# Patient Record
Sex: Female | Born: 1935 | Race: White | Hispanic: No | Smoking: Never smoker
Health system: Southern US, Community
[De-identification: ages and names within clinical notes are randomized; demographics above are authoritative.]

## PROBLEM LIST (undated history)

## (undated) DIAGNOSIS — R3129 Other microscopic hematuria: Secondary | ICD-10-CM

## (undated) DIAGNOSIS — K5790 Diverticulosis of intestine, part unspecified, without perforation or abscess without bleeding: Secondary | ICD-10-CM

## (undated) DIAGNOSIS — M8580 Other specified disorders of bone density and structure, unspecified site: Secondary | ICD-10-CM

## (undated) DIAGNOSIS — I10 Essential (primary) hypertension: Secondary | ICD-10-CM

## (undated) HISTORY — PX: TOTAL ABDOMINAL HYSTERECTOMY: SHX209

## (undated) HISTORY — DX: Other specified disorders of bone density and structure, unspecified site: M85.80

## (undated) HISTORY — PX: COLONOSCOPY: SHX174

## (undated) HISTORY — DX: Other microscopic hematuria: R31.29

## (undated) HISTORY — DX: Diverticulosis of intestine, part unspecified, without perforation or abscess without bleeding: K57.90

## (undated) HISTORY — PX: APPENDECTOMY: SHX54

---

## 1997-12-12 ENCOUNTER — Other Ambulatory Visit: Admission: RE | Admit: 1997-12-12 | Discharge: 1997-12-12 | Payer: Self-pay | Admitting: *Deleted

## 1998-12-20 ENCOUNTER — Other Ambulatory Visit: Admission: RE | Admit: 1998-12-20 | Discharge: 1998-12-20 | Payer: Self-pay | Admitting: *Deleted

## 1999-12-23 ENCOUNTER — Other Ambulatory Visit: Admission: RE | Admit: 1999-12-23 | Discharge: 1999-12-23 | Payer: Self-pay | Admitting: *Deleted

## 2000-12-28 ENCOUNTER — Other Ambulatory Visit: Admission: RE | Admit: 2000-12-28 | Discharge: 2000-12-28 | Payer: Self-pay | Admitting: *Deleted

## 2006-04-15 ENCOUNTER — Encounter: Admission: RE | Admit: 2006-04-15 | Discharge: 2006-04-15 | Payer: Self-pay | Admitting: Internal Medicine

## 2006-04-19 ENCOUNTER — Encounter: Admission: RE | Admit: 2006-04-19 | Discharge: 2006-04-19 | Payer: Self-pay | Admitting: Internal Medicine

## 2006-05-13 ENCOUNTER — Ambulatory Visit (HOSPITAL_COMMUNITY): Admission: RE | Admit: 2006-05-13 | Discharge: 2006-05-13 | Payer: Self-pay | Admitting: *Deleted

## 2008-04-29 IMAGING — CT CT ABDOMEN W/ CM
2 of 5 series · 17 of 46 positions shown, 19 images · IV contrast (READICAT/WATER & [ID] OMNI 300)
Comparison: none

CLINICAL DATA: Right abdominal pain. Previous appendectomy.

[Series 3: routine abdomen · axial · 0.60mm/px · z∈[-386,-40]mm · 14 of 76 slices shown, 16 images]
[im 5/76  soft-tissue]
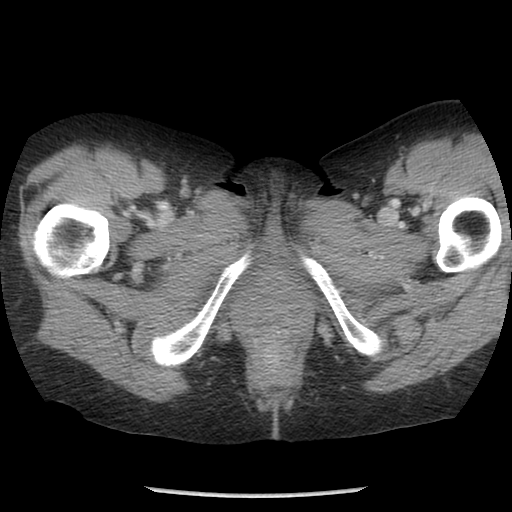
[im 5/76  bone]
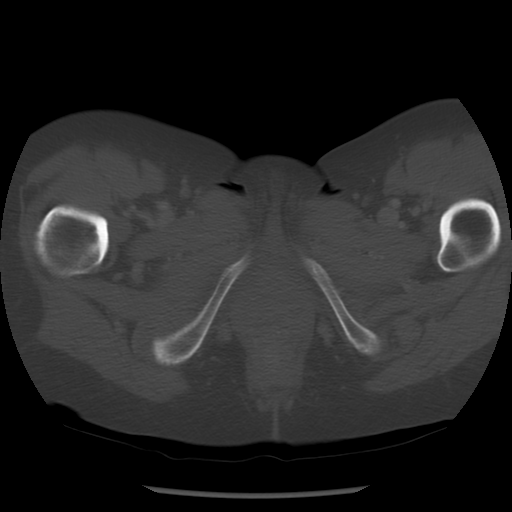
[im 9/76  soft-tissue]
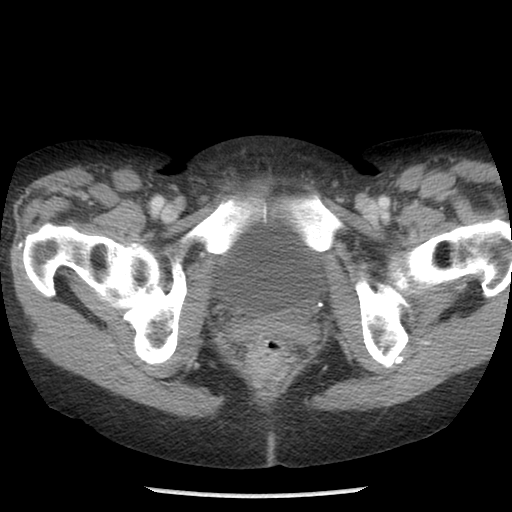
[im 14/76  soft-tissue]
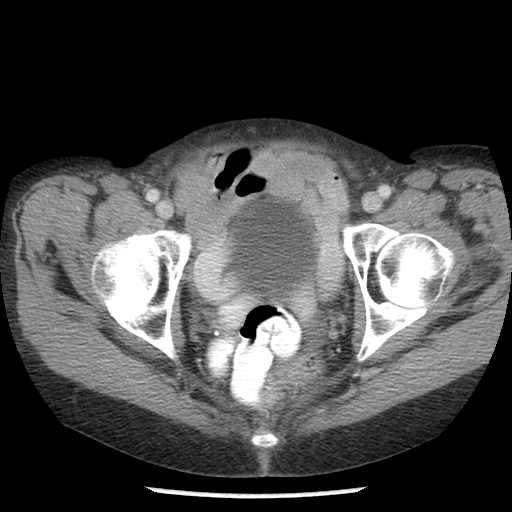
[im 23/76  soft-tissue]
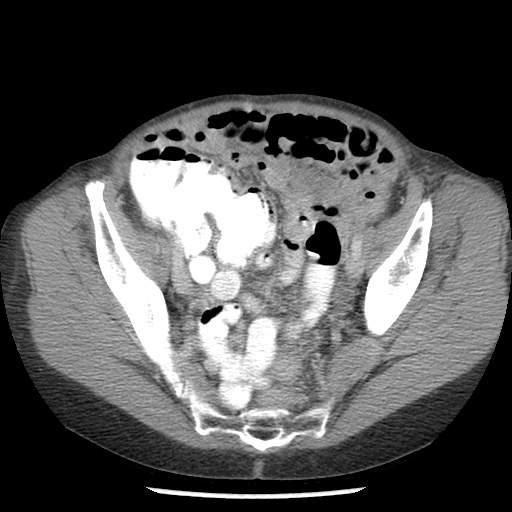
[im 27/76  soft-tissue]
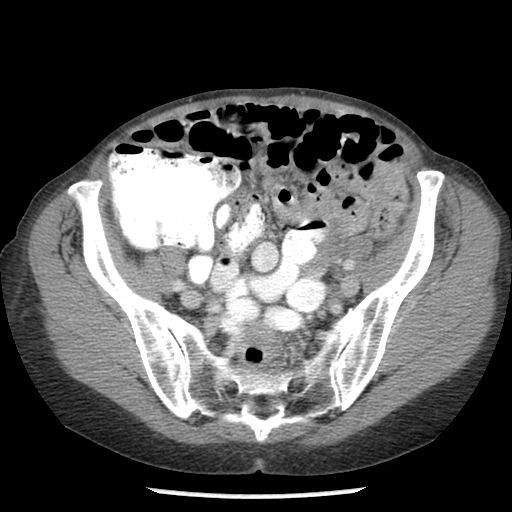
[im 31/76  soft-tissue]
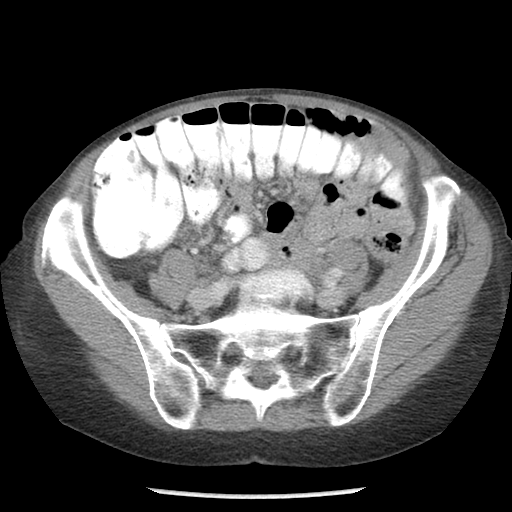
[im 36/76  soft-tissue]
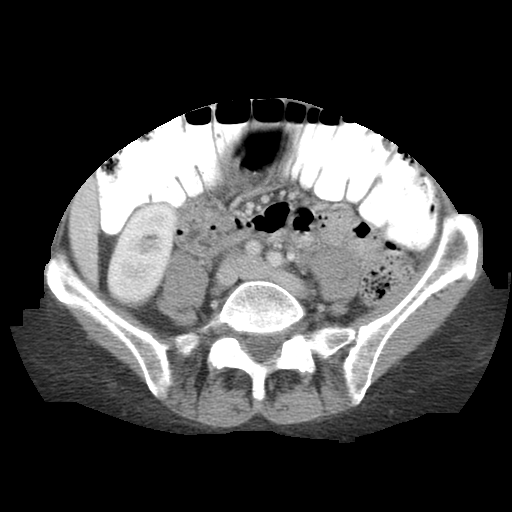
[im 40/76  soft-tissue]
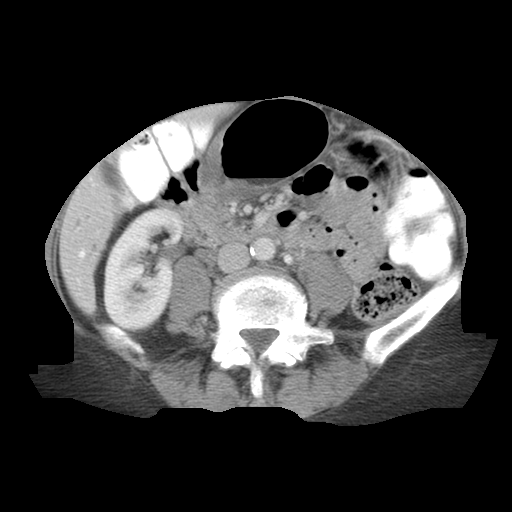
[im 45/76  soft-tissue]
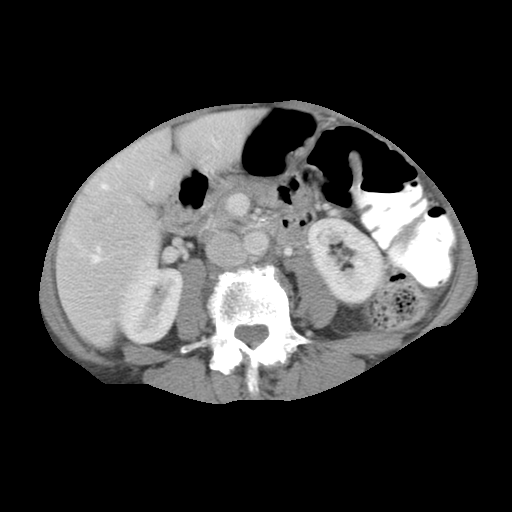
[im 45/76  bone]
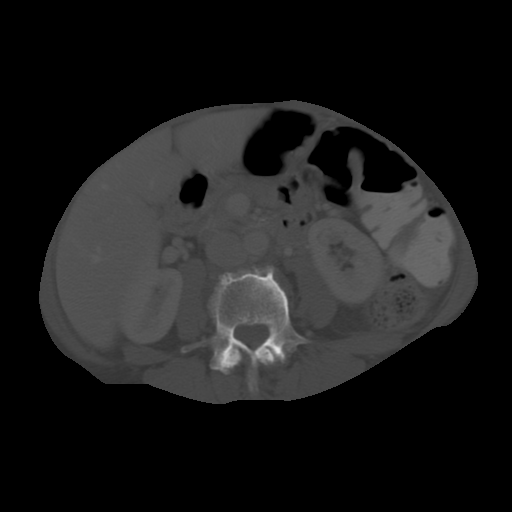
[im 49/76  soft-tissue]
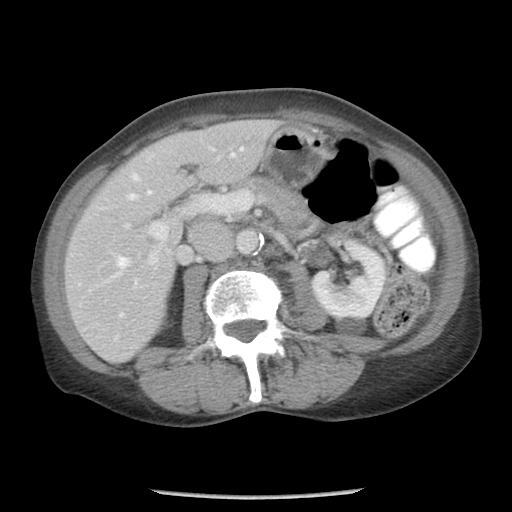
[im 58/76  soft-tissue]
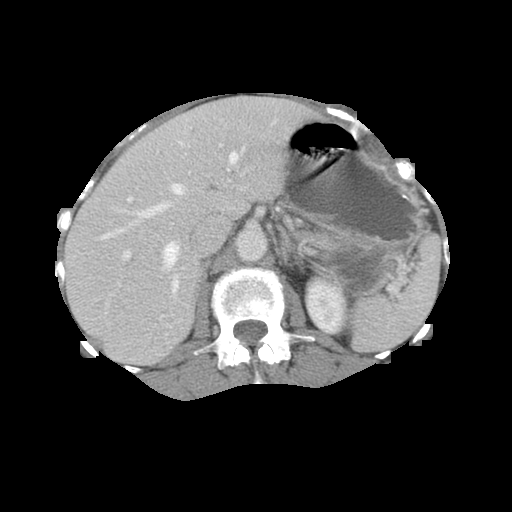
[im 62/76  soft-tissue]
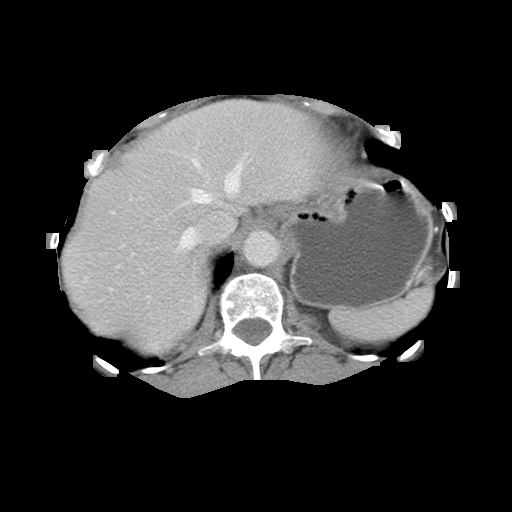
[im 67/76  soft-tissue]
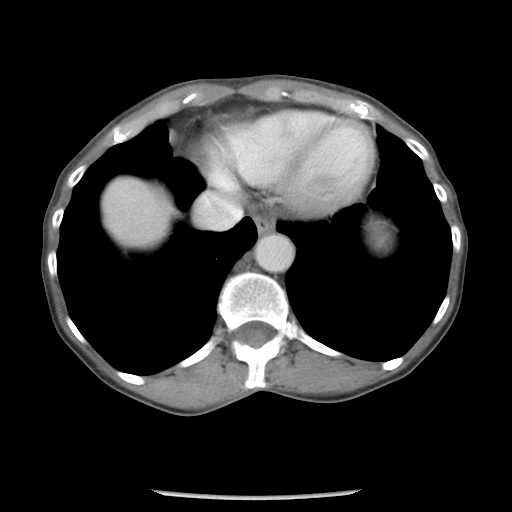
[im 71/76  soft-tissue]
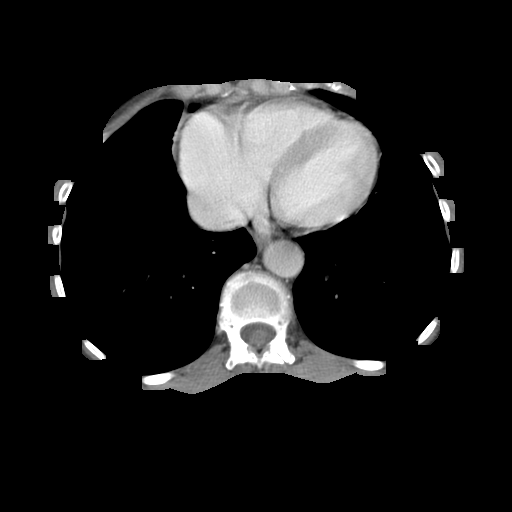

[Series 602: sagittal body · sagittal · 0.77mm/px · 3 of 123 slices shown]
[im 41/123  soft-tissue]
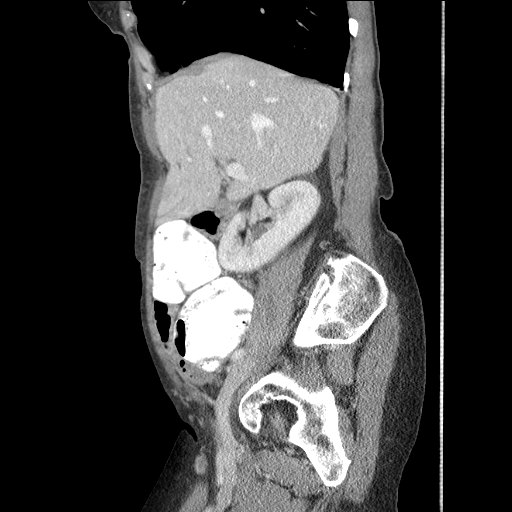
[im 55/123  soft-tissue]
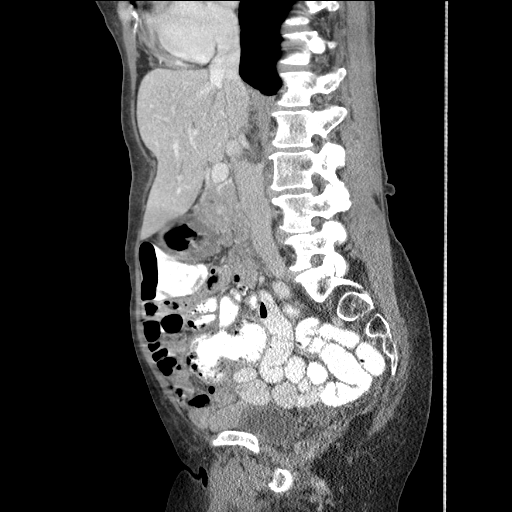
[im 68/123  soft-tissue]
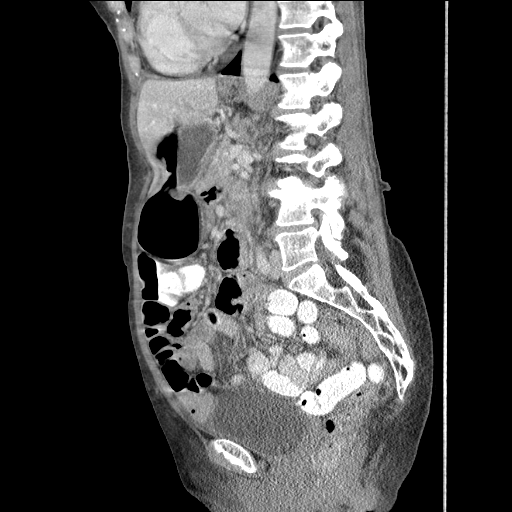

[17 of 46 positions shown; findings below may reference images not displayed]

CT abdomen with contrast:

Multidetector helical CT after 100 ml [id number]-8BB IV.
Comparison 04/15/2006. Visualized lung bases clear. Unremarkable liver,
nondistended gallbladder, spleen, adrenal glands, right kidney. Stable exophytic
cyst from the interpolar region left kidney. No hydronephrosis. There may be
some mild thickening in the region the pylorus, evaluation limited by lack of
oral contrast through this region. Minimal atheromatous change in the abdominal
aorta without aneurysm. Portal vein patent. Small bowel is nondilated. No free
air. No ascites. No adenopathy. Delayed scans showed normal excretion by both
kidneys. Coronal and sagittal reconstructions confirm the above findings.
Degenerative changes in the lumbar spine are noted.
IMPRESSION: 1. Unremarkable CT abdomen.

CT pelvis with contrast:

Colon is nondilated, unremarkable. Urinary bladder physiologically distended.
Bilateral pelvic phleboliths. No free fluid. No adenopathy.
IMPRESSION: 1. Unremarkable CT pelvis.

## 2012-11-15 ENCOUNTER — Encounter: Payer: Self-pay | Admitting: Obstetrics & Gynecology

## 2012-11-16 ENCOUNTER — Encounter: Payer: Self-pay | Admitting: Obstetrics & Gynecology

## 2012-11-16 ENCOUNTER — Ambulatory Visit: Payer: Self-pay | Admitting: Obstetrics & Gynecology

## 2012-11-16 DIAGNOSIS — Z01419 Encounter for gynecological examination (general) (routine) without abnormal findings: Secondary | ICD-10-CM

## 2012-11-18 ENCOUNTER — Ambulatory Visit: Payer: Self-pay | Admitting: Obstetrics & Gynecology

## 2014-02-19 ENCOUNTER — Encounter: Payer: Self-pay | Admitting: Obstetrics & Gynecology

## 2014-05-07 DIAGNOSIS — F411 Generalized anxiety disorder: Secondary | ICD-10-CM | POA: Diagnosis not present

## 2014-05-07 DIAGNOSIS — M810 Age-related osteoporosis without current pathological fracture: Secondary | ICD-10-CM | POA: Diagnosis not present

## 2014-05-28 DIAGNOSIS — J310 Chronic rhinitis: Secondary | ICD-10-CM | POA: Diagnosis not present

## 2014-05-28 DIAGNOSIS — L309 Dermatitis, unspecified: Secondary | ICD-10-CM | POA: Diagnosis not present

## 2014-10-23 DIAGNOSIS — F5104 Psychophysiologic insomnia: Secondary | ICD-10-CM | POA: Diagnosis not present

## 2014-10-23 DIAGNOSIS — M810 Age-related osteoporosis without current pathological fracture: Secondary | ICD-10-CM | POA: Diagnosis not present

## 2014-11-26 DIAGNOSIS — L03116 Cellulitis of left lower limb: Secondary | ICD-10-CM | POA: Diagnosis not present

## 2014-12-17 DIAGNOSIS — L03116 Cellulitis of left lower limb: Secondary | ICD-10-CM | POA: Diagnosis not present

## 2015-02-16 DIAGNOSIS — L03116 Cellulitis of left lower limb: Secondary | ICD-10-CM | POA: Diagnosis not present

## 2015-03-15 DIAGNOSIS — L03116 Cellulitis of left lower limb: Secondary | ICD-10-CM | POA: Diagnosis not present

## 2015-05-21 DIAGNOSIS — I10 Essential (primary) hypertension: Secondary | ICD-10-CM | POA: Diagnosis not present

## 2015-05-21 DIAGNOSIS — R238 Other skin changes: Secondary | ICD-10-CM | POA: Diagnosis not present

## 2015-05-21 DIAGNOSIS — Z23 Encounter for immunization: Secondary | ICD-10-CM | POA: Diagnosis not present

## 2015-05-21 DIAGNOSIS — Z0000 Encounter for general adult medical examination without abnormal findings: Secondary | ICD-10-CM | POA: Diagnosis not present

## 2015-05-21 DIAGNOSIS — Z79899 Other long term (current) drug therapy: Secondary | ICD-10-CM | POA: Diagnosis not present

## 2015-05-21 DIAGNOSIS — M8580 Other specified disorders of bone density and structure, unspecified site: Secondary | ICD-10-CM | POA: Diagnosis not present

## 2015-05-24 DIAGNOSIS — I10 Essential (primary) hypertension: Secondary | ICD-10-CM | POA: Diagnosis not present

## 2015-05-24 DIAGNOSIS — M810 Age-related osteoporosis without current pathological fracture: Secondary | ICD-10-CM | POA: Diagnosis not present

## 2015-05-24 DIAGNOSIS — Z0000 Encounter for general adult medical examination without abnormal findings: Secondary | ICD-10-CM | POA: Diagnosis not present

## 2015-08-26 DIAGNOSIS — S52531A Colles' fracture of right radius, initial encounter for closed fracture: Secondary | ICD-10-CM | POA: Diagnosis not present

## 2015-08-26 DIAGNOSIS — M25531 Pain in right wrist: Secondary | ICD-10-CM | POA: Diagnosis not present

## 2015-09-09 DIAGNOSIS — S52531D Colles' fracture of right radius, subsequent encounter for closed fracture with routine healing: Secondary | ICD-10-CM | POA: Diagnosis not present

## 2015-09-20 ENCOUNTER — Emergency Department (HOSPITAL_COMMUNITY)
Admission: EM | Admit: 2015-09-20 | Discharge: 2015-09-20 | Disposition: A | Discharge status: Home / Self Care | Payer: No Typology Code available for payment source | Attending: Emergency Medicine | Admitting: Emergency Medicine

## 2015-09-20 ENCOUNTER — Encounter (HOSPITAL_COMMUNITY): Payer: Self-pay | Admitting: Emergency Medicine

## 2015-09-20 DIAGNOSIS — S8012XA Contusion of left lower leg, initial encounter: Secondary | ICD-10-CM | POA: Diagnosis not present

## 2015-09-20 DIAGNOSIS — Y92410 Unspecified street and highway as the place of occurrence of the external cause: Secondary | ICD-10-CM | POA: Diagnosis not present

## 2015-09-20 DIAGNOSIS — R0789 Other chest pain: Secondary | ICD-10-CM | POA: Diagnosis not present

## 2015-09-20 DIAGNOSIS — S8011XA Contusion of right lower leg, initial encounter: Secondary | ICD-10-CM | POA: Insufficient documentation

## 2015-09-20 DIAGNOSIS — R071 Chest pain on breathing: Secondary | ICD-10-CM | POA: Diagnosis not present

## 2015-09-20 DIAGNOSIS — Y999 Unspecified external cause status: Secondary | ICD-10-CM | POA: Diagnosis not present

## 2015-09-20 DIAGNOSIS — Y939 Activity, unspecified: Secondary | ICD-10-CM | POA: Insufficient documentation

## 2015-09-20 DIAGNOSIS — S8991XA Unspecified injury of right lower leg, initial encounter: Secondary | ICD-10-CM | POA: Diagnosis present

## 2015-09-20 DIAGNOSIS — S2220XA Unspecified fracture of sternum, initial encounter for closed fracture: Secondary | ICD-10-CM | POA: Diagnosis not present

## 2015-09-20 DIAGNOSIS — Z041 Encounter for examination and observation following transport accident: Secondary | ICD-10-CM | POA: Diagnosis not present

## 2015-09-20 HISTORY — DX: Essential (primary) hypertension: I10
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
## 2015-09-20 NOTE — Discharge Instructions (Signed)

## 2015-10-21 DIAGNOSIS — S52571A Other intraarticular fracture of lower end of right radius, initial encounter for closed fracture: Secondary | ICD-10-CM | POA: Diagnosis not present

## 2015-10-21 DIAGNOSIS — M720 Palmar fascial fibromatosis [Dupuytren]: Secondary | ICD-10-CM | POA: Diagnosis not present

## 2016-01-13 DIAGNOSIS — M720 Palmar fascial fibromatosis [Dupuytren]: Secondary | ICD-10-CM | POA: Diagnosis not present

## 2016-01-15 DIAGNOSIS — M720 Palmar fascial fibromatosis [Dupuytren]: Secondary | ICD-10-CM | POA: Diagnosis not present

## 2016-05-28 DIAGNOSIS — I10 Essential (primary) hypertension: Secondary | ICD-10-CM | POA: Diagnosis not present

## 2016-05-28 DIAGNOSIS — M8580 Other specified disorders of bone density and structure, unspecified site: Secondary | ICD-10-CM | POA: Diagnosis not present

## 2016-05-28 DIAGNOSIS — Z0000 Encounter for general adult medical examination without abnormal findings: Secondary | ICD-10-CM | POA: Diagnosis not present

## 2016-05-28 DIAGNOSIS — E559 Vitamin D deficiency, unspecified: Secondary | ICD-10-CM | POA: Diagnosis not present

## 2016-05-28 DIAGNOSIS — M810 Age-related osteoporosis without current pathological fracture: Secondary | ICD-10-CM | POA: Diagnosis not present

## 2016-05-29 DIAGNOSIS — M720 Palmar fascial fibromatosis [Dupuytren]: Secondary | ICD-10-CM | POA: Diagnosis not present

## 2016-05-29 DIAGNOSIS — K649 Unspecified hemorrhoids: Secondary | ICD-10-CM | POA: Diagnosis not present

## 2016-05-29 DIAGNOSIS — E875 Hyperkalemia: Secondary | ICD-10-CM | POA: Diagnosis not present

## 2016-05-29 DIAGNOSIS — I10 Essential (primary) hypertension: Secondary | ICD-10-CM | POA: Diagnosis not present

## 2016-05-29 DIAGNOSIS — Z0001 Encounter for general adult medical examination with abnormal findings: Secondary | ICD-10-CM | POA: Diagnosis not present

## 2016-06-12 DIAGNOSIS — H524 Presbyopia: Secondary | ICD-10-CM | POA: Diagnosis not present

## 2016-06-12 DIAGNOSIS — Z961 Presence of intraocular lens: Secondary | ICD-10-CM | POA: Diagnosis not present

## 2016-06-12 DIAGNOSIS — H04123 Dry eye syndrome of bilateral lacrimal glands: Secondary | ICD-10-CM | POA: Diagnosis not present

## 2016-06-12 DIAGNOSIS — H16103 Unspecified superficial keratitis, bilateral: Secondary | ICD-10-CM | POA: Diagnosis not present

## 2016-07-07 DIAGNOSIS — L821 Other seborrheic keratosis: Secondary | ICD-10-CM | POA: Diagnosis not present

## 2016-07-09 DIAGNOSIS — H26491 Other secondary cataract, right eye: Secondary | ICD-10-CM | POA: Diagnosis not present

## 2016-07-16 DIAGNOSIS — H26492 Other secondary cataract, left eye: Secondary | ICD-10-CM | POA: Diagnosis not present

## 2017-07-05 DIAGNOSIS — M810 Age-related osteoporosis without current pathological fracture: Secondary | ICD-10-CM | POA: Diagnosis not present

## 2017-07-05 DIAGNOSIS — I10 Essential (primary) hypertension: Secondary | ICD-10-CM | POA: Diagnosis not present

## 2017-07-05 DIAGNOSIS — N390 Urinary tract infection, site not specified: Secondary | ICD-10-CM | POA: Diagnosis not present

## 2017-07-07 DIAGNOSIS — R030 Elevated blood-pressure reading, without diagnosis of hypertension: Secondary | ICD-10-CM | POA: Diagnosis not present

## 2017-07-07 DIAGNOSIS — D171 Benign lipomatous neoplasm of skin and subcutaneous tissue of trunk: Secondary | ICD-10-CM | POA: Diagnosis not present

## 2017-07-07 DIAGNOSIS — I10 Essential (primary) hypertension: Secondary | ICD-10-CM | POA: Diagnosis not present

## 2017-07-07 DIAGNOSIS — Z0001 Encounter for general adult medical examination with abnormal findings: Secondary | ICD-10-CM | POA: Diagnosis not present

## 2017-07-08 DIAGNOSIS — M810 Age-related osteoporosis without current pathological fracture: Secondary | ICD-10-CM | POA: Diagnosis not present

## 2017-10-07 DIAGNOSIS — H16103 Unspecified superficial keratitis, bilateral: Secondary | ICD-10-CM | POA: Diagnosis not present

## 2017-10-07 DIAGNOSIS — H0100A Unspecified blepharitis right eye, upper and lower eyelids: Secondary | ICD-10-CM | POA: Diagnosis not present

## 2017-10-07 DIAGNOSIS — H52203 Unspecified astigmatism, bilateral: Secondary | ICD-10-CM | POA: Diagnosis not present

## 2017-10-07 DIAGNOSIS — H04123 Dry eye syndrome of bilateral lacrimal glands: Secondary | ICD-10-CM | POA: Diagnosis not present

## 2018-01-11 DIAGNOSIS — Z23 Encounter for immunization: Secondary | ICD-10-CM | POA: Diagnosis not present

## 2018-01-11 DIAGNOSIS — M810 Age-related osteoporosis without current pathological fracture: Secondary | ICD-10-CM | POA: Diagnosis not present

## 2018-07-19 DIAGNOSIS — M810 Age-related osteoporosis without current pathological fracture: Secondary | ICD-10-CM | POA: Diagnosis not present

## 2018-10-13 DIAGNOSIS — H52203 Unspecified astigmatism, bilateral: Secondary | ICD-10-CM | POA: Diagnosis not present

## 2018-10-13 DIAGNOSIS — H16223 Keratoconjunctivitis sicca, not specified as Sjogren's, bilateral: Secondary | ICD-10-CM | POA: Diagnosis not present

## 2018-10-13 DIAGNOSIS — H43813 Vitreous degeneration, bilateral: Secondary | ICD-10-CM | POA: Diagnosis not present

## 2018-10-13 DIAGNOSIS — H353132 Nonexudative age-related macular degeneration, bilateral, intermediate dry stage: Secondary | ICD-10-CM | POA: Diagnosis not present

## 2018-12-21 ENCOUNTER — Other Ambulatory Visit: Payer: Self-pay

## 2018-12-21 DIAGNOSIS — Z20822 Contact with and (suspected) exposure to covid-19: Secondary | ICD-10-CM

## 2018-12-22 LAB — NOVEL CORONAVIRUS, NAA: SARS-CoV-2, NAA: NOT DETECTED

## 2019-01-24 DIAGNOSIS — M810 Age-related osteoporosis without current pathological fracture: Secondary | ICD-10-CM | POA: Diagnosis not present

## 2019-02-06 DIAGNOSIS — I10 Essential (primary) hypertension: Secondary | ICD-10-CM | POA: Diagnosis not present

## 2019-02-06 DIAGNOSIS — M810 Age-related osteoporosis without current pathological fracture: Secondary | ICD-10-CM | POA: Diagnosis not present

## 2019-02-06 LAB — CBC AND DIFFERENTIAL
HCT: 41 (ref 36–46)
Hemoglobin: 13.5 (ref 12.0–16.0)
Platelets: 352 (ref 150–399)
WBC: 6.6

## 2019-02-06 LAB — LIPID PANEL
Cholesterol: 213 — AB (ref 0–200)
HDL: 102 — AB (ref 35–70)
LDL Cholesterol: 102
Triglycerides: 51 (ref 40–160)

## 2019-02-06 LAB — BASIC METABOLIC PANEL
BUN: 21 (ref 4–21)
CO2: 24 — AB (ref 13–22)
Chloride: 97 — AB (ref 99–108)
Creatinine: 0.8 (ref 0.5–1.1)
Glucose: 89
Potassium: 4.6 (ref 3.4–5.3)
Sodium: 134 — AB (ref 137–147)

## 2019-02-06 LAB — CBC: RBC: 4.36 (ref 3.87–5.11)

## 2019-02-06 LAB — COMPREHENSIVE METABOLIC PANEL
Albumin: 4.4 (ref 3.5–5.0)
Calcium: 9.2 (ref 8.7–10.7)
Globulin: 2.7

## 2019-02-06 LAB — HEPATIC FUNCTION PANEL
ALT: 14 (ref 7–35)
AST: 24 (ref 13–35)

## 2019-02-06 LAB — VITAMIN D 25 HYDROXY (VIT D DEFICIENCY, FRACTURES): Vit D, 25-Hydroxy: 29

## 2019-02-09 DIAGNOSIS — M810 Age-related osteoporosis without current pathological fracture: Secondary | ICD-10-CM | POA: Diagnosis not present

## 2019-02-09 DIAGNOSIS — Z0000 Encounter for general adult medical examination without abnormal findings: Secondary | ICD-10-CM | POA: Diagnosis not present

## 2019-02-09 DIAGNOSIS — Z23 Encounter for immunization: Secondary | ICD-10-CM | POA: Diagnosis not present

## 2019-02-09 DIAGNOSIS — R350 Frequency of micturition: Secondary | ICD-10-CM | POA: Diagnosis not present

## 2019-03-22 ENCOUNTER — Other Ambulatory Visit: Payer: Self-pay

## 2019-03-22 DIAGNOSIS — Z20822 Contact with and (suspected) exposure to covid-19: Secondary | ICD-10-CM

## 2019-03-24 LAB — NOVEL CORONAVIRUS, NAA: SARS-CoV-2, NAA: NOT DETECTED

## 2019-03-31 DIAGNOSIS — M79642 Pain in left hand: Secondary | ICD-10-CM | POA: Diagnosis not present

## 2019-04-17 DIAGNOSIS — M549 Dorsalgia, unspecified: Secondary | ICD-10-CM | POA: Diagnosis not present

## 2019-04-17 DIAGNOSIS — M1990 Unspecified osteoarthritis, unspecified site: Secondary | ICD-10-CM | POA: Diagnosis not present

## 2019-04-17 DIAGNOSIS — M720 Palmar fascial fibromatosis [Dupuytren]: Secondary | ICD-10-CM | POA: Diagnosis not present

## 2019-04-17 DIAGNOSIS — M79632 Pain in left forearm: Secondary | ICD-10-CM | POA: Diagnosis not present

## 2019-04-18 ENCOUNTER — Ambulatory Visit: Payer: Medicare Other | Attending: Internal Medicine

## 2019-04-18 DIAGNOSIS — R238 Other skin changes: Secondary | ICD-10-CM | POA: Diagnosis not present

## 2019-04-18 DIAGNOSIS — U071 COVID-19: Secondary | ICD-10-CM

## 2019-04-19 LAB — NOVEL CORONAVIRUS, NAA: SARS-CoV-2, NAA: NOT DETECTED

## 2019-05-03 DIAGNOSIS — Z23 Encounter for immunization: Secondary | ICD-10-CM | POA: Diagnosis not present

## 2019-05-18 DIAGNOSIS — H16103 Unspecified superficial keratitis, bilateral: Secondary | ICD-10-CM | POA: Diagnosis not present

## 2019-05-18 DIAGNOSIS — H52203 Unspecified astigmatism, bilateral: Secondary | ICD-10-CM | POA: Diagnosis not present

## 2019-05-18 DIAGNOSIS — H353132 Nonexudative age-related macular degeneration, bilateral, intermediate dry stage: Secondary | ICD-10-CM | POA: Diagnosis not present

## 2019-05-18 DIAGNOSIS — H04123 Dry eye syndrome of bilateral lacrimal glands: Secondary | ICD-10-CM | POA: Diagnosis not present

## 2019-05-30 DIAGNOSIS — Z23 Encounter for immunization: Secondary | ICD-10-CM | POA: Diagnosis not present

## 2019-07-27 DIAGNOSIS — M810 Age-related osteoporosis without current pathological fracture: Secondary | ICD-10-CM | POA: Diagnosis not present

## 2020-01-15 DIAGNOSIS — H52203 Unspecified astigmatism, bilateral: Secondary | ICD-10-CM | POA: Diagnosis not present

## 2020-01-15 DIAGNOSIS — H16103 Unspecified superficial keratitis, bilateral: Secondary | ICD-10-CM | POA: Diagnosis not present

## 2020-01-15 DIAGNOSIS — H353132 Nonexudative age-related macular degeneration, bilateral, intermediate dry stage: Secondary | ICD-10-CM | POA: Diagnosis not present

## 2020-01-15 DIAGNOSIS — H04123 Dry eye syndrome of bilateral lacrimal glands: Secondary | ICD-10-CM | POA: Diagnosis not present

## 2020-01-29 DIAGNOSIS — M810 Age-related osteoporosis without current pathological fracture: Secondary | ICD-10-CM | POA: Diagnosis not present

## 2020-02-08 LAB — CBC AND DIFFERENTIAL: Platelets: 380 (ref 150–399)

## 2020-02-08 LAB — CBC: RBC: 4.33 (ref 3.87–5.11)

## 2020-02-15 DIAGNOSIS — N390 Urinary tract infection, site not specified: Secondary | ICD-10-CM | POA: Diagnosis not present

## 2020-02-15 DIAGNOSIS — I10 Essential (primary) hypertension: Secondary | ICD-10-CM | POA: Diagnosis not present

## 2020-02-15 DIAGNOSIS — M810 Age-related osteoporosis without current pathological fracture: Secondary | ICD-10-CM | POA: Diagnosis not present

## 2020-02-15 LAB — BASIC METABOLIC PANEL
BUN: 21 (ref 4–21)
CO2: 26 — AB (ref 13–22)
Chloride: 95 — AB (ref 99–108)
Creatinine: 0.7 (ref 0.5–1.1)
Glucose: 104
Potassium: 5.2 (ref 3.4–5.3)
Sodium: 132 — AB (ref 137–147)

## 2020-02-15 LAB — COMPREHENSIVE METABOLIC PANEL
Albumin: 4 (ref 3.5–5.0)
Calcium: 9.2 (ref 8.7–10.7)
Globulin: 2.9

## 2020-02-15 LAB — HEPATIC FUNCTION PANEL
ALT: 15 (ref 7–35)
AST: 28 (ref 13–35)

## 2020-02-15 LAB — LIPID PANEL
Cholesterol: 208 — AB (ref 0–200)
HDL: 95 — AB (ref 35–70)
LDL Cholesterol: 105
Triglycerides: 45 (ref 40–160)

## 2020-02-15 LAB — CBC AND DIFFERENTIAL
HCT: 40 (ref 36–46)
Hemoglobin: 13.4 (ref 12.0–16.0)
WBC: 6.8

## 2020-02-15 LAB — VITAMIN D 25 HYDROXY (VIT D DEFICIENCY, FRACTURES): Vit D, 25-Hydroxy: 32.8

## 2020-02-22 DIAGNOSIS — Z0000 Encounter for general adult medical examination without abnormal findings: Secondary | ICD-10-CM | POA: Diagnosis not present

## 2020-02-22 DIAGNOSIS — I10 Essential (primary) hypertension: Secondary | ICD-10-CM | POA: Diagnosis not present

## 2020-02-22 DIAGNOSIS — E871 Hypo-osmolality and hyponatremia: Secondary | ICD-10-CM | POA: Diagnosis not present

## 2020-02-22 DIAGNOSIS — M810 Age-related osteoporosis without current pathological fracture: Secondary | ICD-10-CM | POA: Diagnosis not present

## 2020-03-01 DIAGNOSIS — R030 Elevated blood-pressure reading, without diagnosis of hypertension: Secondary | ICD-10-CM | POA: Diagnosis not present

## 2020-03-01 DIAGNOSIS — E871 Hypo-osmolality and hyponatremia: Secondary | ICD-10-CM | POA: Diagnosis not present

## 2020-03-01 DIAGNOSIS — I10 Essential (primary) hypertension: Secondary | ICD-10-CM | POA: Diagnosis not present

## 2020-03-05 DIAGNOSIS — K1329 Other disturbances of oral epithelium, including tongue: Secondary | ICD-10-CM | POA: Diagnosis not present

## 2020-04-02 DIAGNOSIS — E871 Hypo-osmolality and hyponatremia: Secondary | ICD-10-CM | POA: Diagnosis not present

## 2020-04-02 DIAGNOSIS — M25512 Pain in left shoulder: Secondary | ICD-10-CM | POA: Diagnosis not present

## 2020-04-02 DIAGNOSIS — R030 Elevated blood-pressure reading, without diagnosis of hypertension: Secondary | ICD-10-CM | POA: Diagnosis not present

## 2020-04-02 DIAGNOSIS — R350 Frequency of micturition: Secondary | ICD-10-CM | POA: Diagnosis not present

## 2020-04-02 DIAGNOSIS — I10 Essential (primary) hypertension: Secondary | ICD-10-CM | POA: Diagnosis not present

## 2020-04-15 DIAGNOSIS — M25551 Pain in right hip: Secondary | ICD-10-CM | POA: Diagnosis not present

## 2020-04-15 DIAGNOSIS — M545 Low back pain, unspecified: Secondary | ICD-10-CM | POA: Diagnosis not present

## 2020-04-15 DIAGNOSIS — R600 Localized edema: Secondary | ICD-10-CM | POA: Diagnosis not present

## 2020-04-16 ENCOUNTER — Encounter: Payer: Self-pay | Admitting: Internal Medicine

## 2020-04-16 ENCOUNTER — Non-Acute Institutional Stay (SKILLED_NURSING_FACILITY): Payer: Medicare Other | Admitting: Internal Medicine

## 2020-04-16 DIAGNOSIS — M810 Age-related osteoporosis without current pathological fracture: Secondary | ICD-10-CM | POA: Diagnosis not present

## 2020-04-16 DIAGNOSIS — N3281 Overactive bladder: Secondary | ICD-10-CM

## 2020-04-16 DIAGNOSIS — R600 Localized edema: Secondary | ICD-10-CM

## 2020-04-16 DIAGNOSIS — I10 Essential (primary) hypertension: Secondary | ICD-10-CM

## 2020-04-16 DIAGNOSIS — S329XXA Fracture of unspecified parts of lumbosacral spine and pelvis, initial encounter for closed fracture: Secondary | ICD-10-CM | POA: Diagnosis not present
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
## 2020-04-21 DIAGNOSIS — R600 Localized edema: Secondary | ICD-10-CM | POA: Insufficient documentation

## 2020-04-21 DIAGNOSIS — I10 Essential (primary) hypertension: Secondary | ICD-10-CM | POA: Insufficient documentation

## 2020-04-21 DIAGNOSIS — M810 Age-related osteoporosis without current pathological fracture: Secondary | ICD-10-CM | POA: Insufficient documentation

## 2020-04-21 DIAGNOSIS — N3281 Overactive bladder: Secondary | ICD-10-CM | POA: Insufficient documentation

## 2020-04-21 DIAGNOSIS — S329XXA Fracture of unspecified parts of lumbosacral spine and pelvis, initial encounter for closed fracture: Secondary | ICD-10-CM | POA: Insufficient documentation

## 2020-04-29 DIAGNOSIS — M25551 Pain in right hip: Secondary | ICD-10-CM | POA: Diagnosis not present

## 2020-04-29 DIAGNOSIS — S32511A Fracture of superior rim of right pubis, initial encounter for closed fracture: Secondary | ICD-10-CM | POA: Diagnosis not present

## 2020-05-07 DIAGNOSIS — R030 Elevated blood-pressure reading, without diagnosis of hypertension: Secondary | ICD-10-CM | POA: Diagnosis not present

## 2020-05-07 DIAGNOSIS — R600 Localized edema: Secondary | ICD-10-CM | POA: Diagnosis not present

## 2020-05-20 DIAGNOSIS — I10 Essential (primary) hypertension: Secondary | ICD-10-CM | POA: Diagnosis not present

## 2020-05-20 DIAGNOSIS — M810 Age-related osteoporosis without current pathological fracture: Secondary | ICD-10-CM | POA: Diagnosis not present

## 2020-05-23 ENCOUNTER — Encounter: Payer: Self-pay | Admitting: Internal Medicine

## 2020-05-23 DIAGNOSIS — R600 Localized edema: Secondary | ICD-10-CM | POA: Diagnosis not present

## 2020-05-30 DIAGNOSIS — R600 Localized edema: Secondary | ICD-10-CM | POA: Diagnosis not present

## 2020-06-07 DIAGNOSIS — S81812A Laceration without foreign body, left lower leg, initial encounter: Secondary | ICD-10-CM | POA: Diagnosis not present

## 2020-06-10 DIAGNOSIS — S329XXA Fracture of unspecified parts of lumbosacral spine and pelvis, initial encounter for closed fracture: Secondary | ICD-10-CM | POA: Diagnosis not present

## 2020-06-10 DIAGNOSIS — M1990 Unspecified osteoarthritis, unspecified site: Secondary | ICD-10-CM | POA: Diagnosis not present

## 2020-06-10 DIAGNOSIS — S32511D Fracture of superior rim of right pubis, subsequent encounter for fracture with routine healing: Secondary | ICD-10-CM | POA: Diagnosis not present

## 2020-06-10 DIAGNOSIS — M810 Age-related osteoporosis without current pathological fracture: Secondary | ICD-10-CM | POA: Diagnosis not present

## 2020-06-11 DIAGNOSIS — R7989 Other specified abnormal findings of blood chemistry: Secondary | ICD-10-CM | POA: Diagnosis not present

## 2020-06-11 DIAGNOSIS — R030 Elevated blood-pressure reading, without diagnosis of hypertension: Secondary | ICD-10-CM | POA: Diagnosis not present

## 2020-06-11 DIAGNOSIS — R600 Localized edema: Secondary | ICD-10-CM | POA: Diagnosis not present

## 2020-06-11 DIAGNOSIS — E871 Hypo-osmolality and hyponatremia: Secondary | ICD-10-CM | POA: Diagnosis not present

## 2020-06-11 DIAGNOSIS — I10 Essential (primary) hypertension: Secondary | ICD-10-CM | POA: Diagnosis not present

## 2020-06-11 DIAGNOSIS — R748 Abnormal levels of other serum enzymes: Secondary | ICD-10-CM | POA: Diagnosis not present

## 2020-06-25 DIAGNOSIS — R7989 Other specified abnormal findings of blood chemistry: Secondary | ICD-10-CM | POA: Diagnosis not present

## 2020-06-25 DIAGNOSIS — E871 Hypo-osmolality and hyponatremia: Secondary | ICD-10-CM | POA: Diagnosis not present

## 2020-06-25 DIAGNOSIS — R030 Elevated blood-pressure reading, without diagnosis of hypertension: Secondary | ICD-10-CM | POA: Diagnosis not present

## 2020-06-25 DIAGNOSIS — R748 Abnormal levels of other serum enzymes: Secondary | ICD-10-CM | POA: Diagnosis not present

## 2020-07-01 DIAGNOSIS — R600 Localized edema: Secondary | ICD-10-CM | POA: Diagnosis not present

## 2020-07-03 DIAGNOSIS — H16223 Keratoconjunctivitis sicca, not specified as Sjogren's, bilateral: Secondary | ICD-10-CM | POA: Diagnosis not present

## 2020-07-03 DIAGNOSIS — H43813 Vitreous degeneration, bilateral: Secondary | ICD-10-CM | POA: Diagnosis not present

## 2020-07-03 DIAGNOSIS — H52203 Unspecified astigmatism, bilateral: Secondary | ICD-10-CM | POA: Diagnosis not present

## 2020-07-03 DIAGNOSIS — H353132 Nonexudative age-related macular degeneration, bilateral, intermediate dry stage: Secondary | ICD-10-CM | POA: Diagnosis not present

## 2020-08-01 DIAGNOSIS — M810 Age-related osteoporosis without current pathological fracture: Secondary | ICD-10-CM | POA: Diagnosis not present

## 2020-08-12 DIAGNOSIS — R5381 Other malaise: Secondary | ICD-10-CM | POA: Diagnosis not present

## 2020-08-12 DIAGNOSIS — R600 Localized edema: Secondary | ICD-10-CM | POA: Diagnosis not present

## 2020-08-12 DIAGNOSIS — I10 Essential (primary) hypertension: Secondary | ICD-10-CM | POA: Diagnosis not present

## 2020-12-02 DIAGNOSIS — H353132 Nonexudative age-related macular degeneration, bilateral, intermediate dry stage: Secondary | ICD-10-CM | POA: Diagnosis not present

## 2020-12-02 DIAGNOSIS — H04123 Dry eye syndrome of bilateral lacrimal glands: Secondary | ICD-10-CM | POA: Diagnosis not present

## 2020-12-13 ENCOUNTER — Ambulatory Visit: Payer: Medicare Other | Attending: Internal Medicine

## 2020-12-13 ENCOUNTER — Other Ambulatory Visit (HOSPITAL_BASED_OUTPATIENT_CLINIC_OR_DEPARTMENT_OTHER): Payer: Self-pay

## 2020-12-13 ENCOUNTER — Other Ambulatory Visit: Payer: Self-pay

## 2020-12-13 DIAGNOSIS — Z23 Encounter for immunization: Secondary | ICD-10-CM

## 2020-12-13 MED ORDER — COVID-19 MRNA VACC (MODERNA) 100 MCG/0.5ML IM SUSP
INTRAMUSCULAR | 0 refills | Status: DC
  Filled 2020-12-13: qty 0.25, 1d supply, fill #0

## 2020-12-13 NOTE — Progress Notes (Signed)
   Covid-19 Vaccination Clinic  Name:  Yvonne Fry    MRN: [id number] DOB: Feb 02, 1929  12/13/2020  Ms. Gregorek was observed post Covid-19 immunization for 15 minutes without incident. She was provided with Vaccine Information Sheet and instruction to access the V-Safe system.   Ms. Brause was instructed to call 911 with any severe reactions post vaccine: Difficulty breathing  Swelling of face and throat  A fast heartbeat  A bad rash all over body  Dizziness and weakness   Immunizations Administered     Name Date Dose VIS Date Route   Moderna Covid-19 Booster Vaccine 12/13/2020  1:48 PM 0.25 mL 02/07/2020 Intramuscular   Manufacturer: Moderna   Lot: [id number]   [id number]

## 2021-01-27 DIAGNOSIS — M810 Age-related osteoporosis without current pathological fracture: Secondary | ICD-10-CM | POA: Diagnosis not present

## 2021-02-05 DIAGNOSIS — M810 Age-related osteoporosis without current pathological fracture: Secondary | ICD-10-CM | POA: Diagnosis not present

## 2021-02-19 DIAGNOSIS — M720 Palmar fascial fibromatosis [Dupuytren]: Secondary | ICD-10-CM | POA: Diagnosis not present

## 2021-02-19 DIAGNOSIS — M810 Age-related osteoporosis without current pathological fracture: Secondary | ICD-10-CM | POA: Diagnosis not present

## 2021-02-19 DIAGNOSIS — M1990 Unspecified osteoarthritis, unspecified site: Secondary | ICD-10-CM | POA: Diagnosis not present

## 2021-02-20 DIAGNOSIS — M810 Age-related osteoporosis without current pathological fracture: Secondary | ICD-10-CM | POA: Diagnosis not present

## 2021-02-20 DIAGNOSIS — I10 Essential (primary) hypertension: Secondary | ICD-10-CM | POA: Diagnosis not present

## 2021-02-28 DIAGNOSIS — Z0000 Encounter for general adult medical examination without abnormal findings: Secondary | ICD-10-CM | POA: Diagnosis not present

## 2021-02-28 DIAGNOSIS — R29898 Other symptoms and signs involving the musculoskeletal system: Secondary | ICD-10-CM | POA: Diagnosis not present

## 2021-06-17 DIAGNOSIS — R21 Rash and other nonspecific skin eruption: Secondary | ICD-10-CM | POA: Diagnosis not present

## 2021-06-17 DIAGNOSIS — L509 Urticaria, unspecified: Secondary | ICD-10-CM | POA: Diagnosis not present

## 2021-06-25 DIAGNOSIS — M21371 Foot drop, right foot: Secondary | ICD-10-CM | POA: Diagnosis not present

## 2021-08-12 DIAGNOSIS — M810 Age-related osteoporosis without current pathological fracture: Secondary | ICD-10-CM | POA: Diagnosis not present

## 2021-08-22 DIAGNOSIS — J3089 Other allergic rhinitis: Secondary | ICD-10-CM | POA: Diagnosis not present

## 2021-08-22 DIAGNOSIS — R21 Rash and other nonspecific skin eruption: Secondary | ICD-10-CM | POA: Diagnosis not present

## 2021-09-23 DIAGNOSIS — G5731 Lesion of lateral popliteal nerve, right lower limb: Secondary | ICD-10-CM | POA: Diagnosis not present

## 2021-10-20 DIAGNOSIS — R2681 Unsteadiness on feet: Secondary | ICD-10-CM | POA: Diagnosis not present

## 2021-10-20 DIAGNOSIS — M62571 Muscle wasting and atrophy, not elsewhere classified, right ankle and foot: Secondary | ICD-10-CM | POA: Diagnosis not present

## 2021-10-20 DIAGNOSIS — M21371 Foot drop, right foot: Secondary | ICD-10-CM | POA: Diagnosis not present

## 2021-10-20 DIAGNOSIS — R2689 Other abnormalities of gait and mobility: Secondary | ICD-10-CM | POA: Diagnosis not present

## 2021-10-22 DIAGNOSIS — R2689 Other abnormalities of gait and mobility: Secondary | ICD-10-CM | POA: Diagnosis not present

## 2021-10-22 DIAGNOSIS — M62571 Muscle wasting and atrophy, not elsewhere classified, right ankle and foot: Secondary | ICD-10-CM | POA: Diagnosis not present

## 2021-10-22 DIAGNOSIS — M21371 Foot drop, right foot: Secondary | ICD-10-CM | POA: Diagnosis not present

## 2021-10-22 DIAGNOSIS — R2681 Unsteadiness on feet: Secondary | ICD-10-CM | POA: Diagnosis not present

## 2021-10-23 DIAGNOSIS — Z1159 Encounter for screening for other viral diseases: Secondary | ICD-10-CM | POA: Diagnosis not present

## 2021-10-23 DIAGNOSIS — Z79899 Other long term (current) drug therapy: Secondary | ICD-10-CM | POA: Diagnosis not present

## 2021-10-27 DIAGNOSIS — M21371 Foot drop, right foot: Secondary | ICD-10-CM | POA: Diagnosis not present

## 2021-10-27 DIAGNOSIS — M3500 Sicca syndrome, unspecified: Secondary | ICD-10-CM | POA: Diagnosis not present

## 2021-10-29 DIAGNOSIS — M21371 Foot drop, right foot: Secondary | ICD-10-CM | POA: Diagnosis not present

## 2021-10-29 DIAGNOSIS — R2681 Unsteadiness on feet: Secondary | ICD-10-CM | POA: Diagnosis not present

## 2021-10-29 DIAGNOSIS — R2689 Other abnormalities of gait and mobility: Secondary | ICD-10-CM | POA: Diagnosis not present

## 2021-10-29 DIAGNOSIS — M62571 Muscle wasting and atrophy, not elsewhere classified, right ankle and foot: Secondary | ICD-10-CM | POA: Diagnosis not present

## 2021-11-05 DIAGNOSIS — M21371 Foot drop, right foot: Secondary | ICD-10-CM | POA: Diagnosis not present

## 2021-11-05 DIAGNOSIS — M62571 Muscle wasting and atrophy, not elsewhere classified, right ankle and foot: Secondary | ICD-10-CM | POA: Diagnosis not present

## 2021-11-05 DIAGNOSIS — R2689 Other abnormalities of gait and mobility: Secondary | ICD-10-CM | POA: Diagnosis not present

## 2021-11-05 DIAGNOSIS — R2681 Unsteadiness on feet: Secondary | ICD-10-CM | POA: Diagnosis not present

## 2021-12-04 DIAGNOSIS — Z961 Presence of intraocular lens: Secondary | ICD-10-CM | POA: Diagnosis not present

## 2021-12-04 DIAGNOSIS — H52203 Unspecified astigmatism, bilateral: Secondary | ICD-10-CM | POA: Diagnosis not present

## 2021-12-04 DIAGNOSIS — H353132 Nonexudative age-related macular degeneration, bilateral, intermediate dry stage: Secondary | ICD-10-CM | POA: Diagnosis not present

## 2021-12-29 DIAGNOSIS — M21371 Foot drop, right foot: Secondary | ICD-10-CM | POA: Diagnosis not present

## 2021-12-29 DIAGNOSIS — Z8781 Personal history of (healed) traumatic fracture: Secondary | ICD-10-CM | POA: Diagnosis not present

## 2021-12-29 DIAGNOSIS — M3500 Sicca syndrome, unspecified: Secondary | ICD-10-CM | POA: Diagnosis not present

## 2021-12-29 DIAGNOSIS — Z7952 Long term (current) use of systemic steroids: Secondary | ICD-10-CM | POA: Diagnosis not present

## 2021-12-29 DIAGNOSIS — Z9181 History of falling: Secondary | ICD-10-CM | POA: Diagnosis not present

## 2022-01-20 ENCOUNTER — Other Ambulatory Visit (HOSPITAL_BASED_OUTPATIENT_CLINIC_OR_DEPARTMENT_OTHER): Payer: Self-pay

## 2022-01-20 MED ORDER — FLUAD QUADRIVALENT 0.5 ML IM PRSY
PREFILLED_SYRINGE | INTRAMUSCULAR | 0 refills | Status: DC
  Filled 2022-01-20: qty 0.5, 1d supply, fill #0

## 2022-02-12 DIAGNOSIS — M810 Age-related osteoporosis without current pathological fracture: Secondary | ICD-10-CM | POA: Diagnosis not present

## 2022-02-26 DIAGNOSIS — M810 Age-related osteoporosis without current pathological fracture: Secondary | ICD-10-CM | POA: Diagnosis not present

## 2022-02-26 DIAGNOSIS — I10 Essential (primary) hypertension: Secondary | ICD-10-CM | POA: Diagnosis not present

## 2022-03-03 DIAGNOSIS — R600 Localized edema: Secondary | ICD-10-CM | POA: Diagnosis not present

## 2022-03-03 DIAGNOSIS — M810 Age-related osteoporosis without current pathological fracture: Secondary | ICD-10-CM | POA: Diagnosis not present

## 2022-03-03 DIAGNOSIS — R350 Frequency of micturition: Secondary | ICD-10-CM | POA: Diagnosis not present

## 2022-03-03 DIAGNOSIS — I10 Essential (primary) hypertension: Secondary | ICD-10-CM | POA: Diagnosis not present

## 2022-03-03 DIAGNOSIS — Z0000 Encounter for general adult medical examination without abnormal findings: Secondary | ICD-10-CM | POA: Diagnosis not present

## 2022-03-03 DIAGNOSIS — J309 Allergic rhinitis, unspecified: Secondary | ICD-10-CM | POA: Diagnosis not present

## 2022-08-19 DIAGNOSIS — M810 Age-related osteoporosis without current pathological fracture: Secondary | ICD-10-CM | POA: Diagnosis not present

## 2022-11-07 ENCOUNTER — Other Ambulatory Visit (HOSPITAL_BASED_OUTPATIENT_CLINIC_OR_DEPARTMENT_OTHER): Payer: Self-pay

## 2022-11-11 DIAGNOSIS — R197 Diarrhea, unspecified: Secondary | ICD-10-CM | POA: Diagnosis not present

## 2022-11-11 DIAGNOSIS — R110 Nausea: Secondary | ICD-10-CM | POA: Diagnosis not present

## 2022-11-12 ENCOUNTER — Other Ambulatory Visit (HOSPITAL_BASED_OUTPATIENT_CLINIC_OR_DEPARTMENT_OTHER): Payer: Self-pay

## 2022-11-12 ENCOUNTER — Inpatient Hospital Stay (HOSPITAL_COMMUNITY): Payer: Medicare Other

## 2022-11-12 ENCOUNTER — Encounter (HOSPITAL_BASED_OUTPATIENT_CLINIC_OR_DEPARTMENT_OTHER): Payer: Self-pay | Admitting: Emergency Medicine

## 2022-11-12 ENCOUNTER — Other Ambulatory Visit: Payer: Self-pay

## 2022-11-12 ENCOUNTER — Inpatient Hospital Stay (HOSPITAL_BASED_OUTPATIENT_CLINIC_OR_DEPARTMENT_OTHER)
Admission: EM | Admit: 2022-11-12 | Discharge: 2022-11-16 | DRG: 641 | Disposition: A | Discharge status: Skilled Nursing Facility | Payer: Medicare Other | Attending: Internal Medicine | Admitting: Internal Medicine

## 2022-11-12 DIAGNOSIS — Z888 Allergy status to other drugs, medicaments and biological substances status: Secondary | ICD-10-CM | POA: Diagnosis not present

## 2022-11-12 DIAGNOSIS — E871 Hypo-osmolality and hyponatremia: Principal | ICD-10-CM | POA: Diagnosis present

## 2022-11-12 DIAGNOSIS — J309 Allergic rhinitis, unspecified: Secondary | ICD-10-CM | POA: Insufficient documentation

## 2022-11-12 DIAGNOSIS — E222 Syndrome of inappropriate secretion of antidiuretic hormone: Secondary | ICD-10-CM | POA: Diagnosis not present

## 2022-11-12 DIAGNOSIS — R109 Unspecified abdominal pain: Secondary | ICD-10-CM | POA: Diagnosis not present

## 2022-11-12 DIAGNOSIS — R5383 Other fatigue: Secondary | ICD-10-CM | POA: Diagnosis not present

## 2022-11-12 DIAGNOSIS — Z79899 Other long term (current) drug therapy: Secondary | ICD-10-CM

## 2022-11-12 DIAGNOSIS — I10 Essential (primary) hypertension: Secondary | ICD-10-CM | POA: Diagnosis present

## 2022-11-12 DIAGNOSIS — R110 Nausea: Secondary | ICD-10-CM | POA: Diagnosis not present

## 2022-11-12 DIAGNOSIS — R519 Headache, unspecified: Secondary | ICD-10-CM | POA: Diagnosis not present

## 2022-11-12 DIAGNOSIS — E860 Dehydration: Secondary | ICD-10-CM | POA: Diagnosis not present

## 2022-11-12 DIAGNOSIS — I700 Atherosclerosis of aorta: Secondary | ICD-10-CM | POA: Diagnosis not present

## 2022-11-12 LAB — BASIC METABOLIC PANEL
Anion gap: 8 (ref 5–15)
Anion gap: 12 (ref 5–15)
BUN: 9 mg/dL (ref 8–23)
BUN: 7 mg/dL — ABNORMAL LOW (ref 8–23)
CO2: 24 mmol/L (ref 22–32)
CO2: 18 mmol/L — ABNORMAL LOW (ref 22–32)
Calcium: 8.4 mg/dL — ABNORMAL LOW (ref 8.9–10.3)
Calcium: 7.7 mg/dL — ABNORMAL LOW (ref 8.9–10.3)
Chloride: 84 mmol/L — ABNORMAL LOW (ref 98–111)
Chloride: 85 mmol/L — ABNORMAL LOW (ref 98–111)
Creatinine, Ser: 0.61 mg/dL (ref 0.44–1.00)
Creatinine, Ser: 0.51 mg/dL (ref 0.44–1.00)
GFR, Estimated: >60 mL/min (ref >60)
GFR, Estimated: >60 mL/min (ref >60)
Glucose, Bld: 100 mg/dL — ABNORMAL HIGH (ref 70–99)
Glucose, Bld: 95 mg/dL (ref 70–99)
Potassium: 4 mmol/L (ref 3.5–5.1)
Potassium: 3.9 mmol/L (ref 3.5–5.1)
Sodium: 116 mmol/L — CL (ref 135–145)
Sodium: 115 mmol/L — CL (ref 135–145)

## 2022-11-12 LAB — URINALYSIS, ROUTINE W REFLEX MICROSCOPIC
Bacteria, UA: NONE SEEN
Bilirubin Urine: NEGATIVE
Glucose, UA: NEGATIVE mg/dL
Hgb urine dipstick: NEGATIVE
Ketones, ur: 15 mg/dL — AB
Nitrite: NEGATIVE
Protein, ur: 30 mg/dL — AB
Specific Gravity, Urine: 1.016 (ref 1.005–1.030)
pH: 5.5 (ref 5.0–8.0)

## 2022-11-12 LAB — CBC WITH DIFFERENTIAL/PLATELET
Abs Immature Granulocytes: 0.03 10*3/uL (ref 0.00–0.07)
Basophils Absolute: 0 10*3/uL (ref 0.0–0.1)
Basophils Relative: 0 %
Eosinophils Absolute: 0.1 10*3/uL (ref 0.0–0.5)
Eosinophils Relative: 1 %
HCT: 39.1 % (ref 36.0–46.0)
Hemoglobin: 13.5 g/dL (ref 12.0–15.0)
Immature Granulocytes: 0 %
Lymphocytes Relative: 18 %
Lymphs Abs: 1.4 10*3/uL (ref 0.7–4.0)
MCH: 30.3 pg (ref 26.0–34.0)
MCHC: 34.5 g/dL (ref 30.0–36.0)
MCV: 87.7 fL (ref 80.0–100.0)
Monocytes Absolute: 0.7 10*3/uL (ref 0.1–1.0)
Monocytes Relative: 10 %
Neutro Abs: 5.2 10*3/uL (ref 1.7–7.7)
Neutrophils Relative %: 71 %
Platelets: 373 10*3/uL (ref 150–400)
RBC: 4.46 MIL/uL (ref 3.87–5.11)
RDW: 11.9 % (ref 11.5–15.5)
WBC: 7.4 10*3/uL (ref 4.0–10.5)
nRBC: 0 % (ref 0.0–0.2)

## 2022-11-12 LAB — MRSA NEXT GEN BY PCR, NASAL: MRSA by PCR Next Gen: NOT DETECTED

## 2022-11-12 LAB — MAGNESIUM: Magnesium: 1.8 mg/dL (ref 1.7–2.4)

## 2022-11-12 LAB — HEPATIC FUNCTION PANEL
ALT: 16 U/L (ref 0–44)
AST: 26 U/L (ref 15–41)
Albumin: 3.6 g/dL (ref 3.5–5.0)
Alkaline Phosphatase: 45 U/L (ref 38–126)
Bilirubin, Direct: 0.2 mg/dL (ref 0.0–0.2)
Indirect Bilirubin: 0.6 mg/dL (ref 0.3–0.9)
Total Bilirubin: 0.8 mg/dL (ref 0.3–1.2)
Total Protein: 6.7 g/dL (ref 6.5–8.1)

## 2022-11-12 LAB — PHOSPHORUS: Phosphorus: 2.4 mg/dL — ABNORMAL LOW (ref 2.5–4.6)

## 2022-11-12 LAB — SODIUM
Sodium: 118 mmol/L — CL (ref 135–145)
Sodium: 120 mmol/L — ABNORMAL LOW (ref 135–145)

## 2022-11-12 LAB — OSMOLALITY: Osmolality: 254 mOsm/kg — ABNORMAL LOW (ref 275–295)

## 2022-11-12 MED ORDER — ONDANSETRON HCL 4 MG PO TABS: 4.0000 mg | ORAL_TABLET | Freq: Four times a day (QID) | ORAL | Status: DC | PRN

## 2022-11-12 MED ORDER — SODIUM CHLORIDE 0.9 % IV SOLN: INTRAVENOUS | Status: DC

## 2022-11-12 MED ORDER — IRBESARTAN 300 MG PO TABS
300.0000 mg | ORAL_TABLET | Freq: Every day | ORAL | Status: DC
  Administered 2022-11-13: 300 mg via ORAL
  Filled 2022-11-12: qty 1

## 2022-11-12 MED ORDER — MELATONIN 5 MG PO TABS
5.0000 mg | ORAL_TABLET | Freq: Every day | ORAL | Status: DC
  Administered 2022-11-12 – 2022-11-15 (×4): 5 mg via ORAL
  Filled 2022-11-12 (×4): qty 1

## 2022-11-12 MED ORDER — K PHOS MONO-SOD PHOS DI & MONO 155-852-130 MG PO TABS
500.0000 mg | ORAL_TABLET | Freq: Two times a day (BID) | ORAL | Status: AC
  Administered 2022-11-12 – 2022-11-13 (×2): 500 mg via ORAL
  Filled 2022-11-12 (×2): qty 2

## 2022-11-12 MED ORDER — POLYVINYL ALCOHOL 1.4 % OP SOLN
1.0000 [drp] | OPHTHALMIC | Status: DC | PRN
  Filled 2022-11-12: qty 15

## 2022-11-12 MED ORDER — ORAL CARE MOUTH RINSE: 15.0000 mL | OROMUCOSAL | Status: DC | PRN

## 2022-11-12 MED ORDER — POLYETHYL GLYCOL-PROPYL GLYCOL 0.4-0.3 % OP GEL: 1.0000 | OPHTHALMIC | Status: DC | PRN

## 2022-11-12 MED ORDER — SODIUM CHLORIDE 3 % IV SOLN
INTRAVENOUS | Status: DC
  Filled 2022-11-12 (×3): qty 500

## 2022-11-12 MED ORDER — ACETAMINOPHEN 325 MG PO TABS
650.0000 mg | ORAL_TABLET | Freq: Four times a day (QID) | ORAL | Status: DC | PRN
  Administered 2022-11-12 – 2022-11-15 (×6): 650 mg via ORAL
  Filled 2022-11-12 (×6): qty 2

## 2022-11-12 MED ORDER — HYDRALAZINE HCL 20 MG/ML IJ SOLN
10.0000 mg | INTRAMUSCULAR | Status: DC | PRN
  Administered 2022-11-12 – 2022-11-13 (×3): 10 mg via INTRAVENOUS
  Filled 2022-11-12 (×4): qty 1

## 2022-11-12 MED ORDER — ACETAMINOPHEN 650 MG RE SUPP: 650.0000 mg | Freq: Four times a day (QID) | RECTAL | Status: DC | PRN

## 2022-11-12 MED ORDER — MELATONIN 3 MG PO TABS: 3.0000 mg | ORAL_TABLET | Freq: Every day | ORAL | Status: DC

## 2022-11-12 MED ORDER — CHLORHEXIDINE GLUCONATE CLOTH 2 % EX PADS
6.0000 | MEDICATED_PAD | Freq: Every day | CUTANEOUS | Status: DC
  Administered 2022-11-12 – 2022-11-14 (×3): 6 via TOPICAL

## 2022-11-12 MED ORDER — ONDANSETRON HCL 4 MG/2ML IJ SOLN: 4.0000 mg | Freq: Four times a day (QID) | INTRAMUSCULAR | Status: DC | PRN

## 2022-11-12 MED ORDER — SODIUM CHLORIDE 0.9 % IV BOLUS
500.0000 mL | Freq: Once | INTRAVENOUS | Status: AC
  Administered 2022-11-12: 500 mL via INTRAVENOUS

## 2022-11-12 MED ORDER — ENOXAPARIN SODIUM 40 MG/0.4ML IJ SOSY
40.0000 mg | PREFILLED_SYRINGE | INTRAMUSCULAR | Status: DC
  Administered 2022-11-12 – 2022-11-15 (×4): 40 mg via SUBCUTANEOUS
  Filled 2022-11-12 (×4): qty 0.4

## 2022-11-12 MED ORDER — HYDRALAZINE HCL 50 MG PO TABS
25.0000 mg | ORAL_TABLET | Freq: Three times a day (TID) | ORAL | Status: DC
  Administered 2022-11-12 – 2022-11-16 (×13): 25 mg via ORAL
  Filled 2022-11-12 (×13): qty 1

## 2022-11-12 NOTE — Consult Note (Addendum)
Renal Service Consult Note [facility]  Yvonne Fry 11/12/2022 Yvonne Krabbe, MD Requesting Physician: Dr. Robb Matar  Reason for Consult: Hyponatremia HPI: The patient is a 89 y.o. year-old w/ PMH as below who presented to ED earlier today from SNF w/ c/o malaise, nausea, and no appetite. Has not been vomiting. Had covid test x 2 which were negative. In ED BP's a bit high, HR 60 and RR wnl. Na+ was 116, creat 0.61, phos 2.4, bun 9, CO2 24 , wbc 7, Hb 13. Serum osm 254 low. Pt was sent to [facility] for admission in SDU. We are asked to see for hyponatremia.   Pt seen in ICU room. Has been feeling tired and nauseous the last few days, not sure which came 1st. Somebody told her to drink a lot of water so she did try to do that. No emesis or diarrhea. No hx low sodium.  No other neuro symptoms.   ROS - denies CP, no joint pain, no HA, no blurry vision, no rash, no dysuria, no difficulty voiding   Past Medical History  Past Medical History:  Diagnosis Date   Diverticulosis    and hemorrhoids   Hematuria, microscopic    Dr Gaspar Garbe negative   Hypertension    Osteopenia    Past Surgical History  Past Surgical History:  Procedure Laterality Date   APPENDECTOMY     COLONOSCOPY     hemorrhoids and diverticulosis   TOTAL ABDOMINAL HYSTERECTOMY     possible BSO   Family History History reviewed. No pertinent family history. Social History  reports that she has never smoked. She has never used smokeless tobacco. She reports current alcohol use. She reports that she does not use drugs. Allergies  Allergies  Allergen Reactions   Neosporin [Neomycin-Bacitracin Zn-Polymyx]    Home medications Prior to Admission medications   Medication Sig Start Date End Date Taking? Authorizing Provider  acetaminophen (TYLENOL) 500 MG tablet Take 1,000 mg by mouth every 8 (eight) hours as needed.   Yes [provider]  cetirizine (ZYRTEC) 10 MG tablet Take 10 mg by mouth  daily. 06/17/21  Yes [provider]  cholecalciferol (VITAMIN D) 25 MCG (1000 UNIT) tablet Take 1,000 Units by mouth daily.   Yes [provider]  denosumab (PROLIA) 60 MG/ML SOSY injection Inject 60 mg into the skin every 6 (six) months.   Yes [provider]  melatonin 3 MG TABS tablet Take 3 mg by mouth at bedtime.   Yes [provider]  Multiple Vitamins-Minerals (MULTIVITAMIN PO) Take by mouth daily.   Yes [provider]  Multiple Vitamins-Minerals (PRESERVISION AREDS 2) CAPS Take by mouth 2 (two) times daily.   Yes [provider]  MYRBETRIQ 25 MG TB24 tablet Take 1 tablet by mouth daily. 06/25/21  Yes [provider]  olmesartan (BENICAR) 40 MG tablet Take 40 mg by mouth daily. 06/25/21  Yes [provider]  ondansetron (ZOFRAN) 4 MG tablet Take 4 mg by mouth. 11/11/22  Yes [provider]  Polyethyl Glycol-Propyl Glycol (SYSTANE ULTRA) 0.4-0.3 % SOLN Apply to eye.   Yes [provider]  Polyethyl Glycol-Propyl Glycol (SYSTANE) 0.4-0.3 % GEL ophthalmic gel Place 1 application into both eyes.   Yes [provider]  triamcinolone (KENALOG) 0.1 % Apply 1 application topically 2 (two) times daily.   Yes [provider]  COVID-19 mRNA vaccine, Moderna, 100 MCG/0.5ML injection Inject into the muscle. 12/13/20   Judyann Munson, MD  influenza vaccine adjuvanted (FLUAD QUADRIVALENT) 0.5 ML injection Inject into the muscle. 01/19/22   Judyann Munson, MD     Vitals:   11/12/22 1200 11/12/22 1235 11/12/22 1300 11/12/22 1330  BP: (!) 173/95 (!) 188/92 (!) 181/98 (!) 191/89  Pulse: 63 65 65 69  Resp: (!) 21 20 18  (!) 21  Temp:      TempSrc:      SpO2: 100% 99% 100% 100%   Exam Gen alert, elderly, pleasant lady in no distress No rash, cyanosis or gangrene No lymph nodes groin, neck or axillae Sclera anicteric, throat may be slightly dry  No jvd or bruits Chest clear bilat to bases, no  rales/ wheezing RRR no RG Abd soft ntnd no mass or ascites +bs GU defer MS no joint effusions or deformity Ext no LE or UE edema, no wounds or ulcers Neuro is alert, Ox 3 , nf      Home meds include - vit D, zyrtec, tylenol, prolia, melatonin, mvi, preservision, myrbetriq, olmesartan, ondansetron, systane gtts, triamcinolone   Assessment/ Plan: Hyponatremia - in elderly pleasant lady w/ not a lot of medical problems. Feeling bad for 3-4 days w/ nausea and malaise, no vomiting/ diarrhea. Meds reviewed, no thiazides/ SSRI's, is on an ARB. No hx or signs of CHF/ liver / renal failure. No hx thyroid disease. Two main causes would be SIADH and vol depletion, or also low solute and reset osmostat. Looks euvolemic on exam. Will await urine labs, dc NS at 50cc/hr and start pt on 3% saline for symptomatic hyponatremia, with goal 24 hr increase in Na+ to be around 8-10 mmol/L or less. Get TSH and am cortisol and CXR. Na+ levels q 3-4 hrs. Will follow.  HTN - on losartan, will hold for now. Use other agents but avoid diuretics and aceis/ arbs. Ordered some po hydralazine.       Vinson Moselle  MD [facility] 11/12/2022, 3:55 PM  Recent Labs  Lab 11/12/22 1131  HGB 13.5  CALCIUM 8.4*  PHOS 2.4*  CREATININE 0.61  K 4.0   Inpatient medications:  Chlorhexidine Gluconate Cloth  6 each Topical Daily   enoxaparin (LOVENOX) injection  40 mg Subcutaneous Q24H    sodium chloride 50 mL/hr at 11/12/22 1446   acetaminophen **OR** acetaminophen, hydrALAZINE, ondansetron **OR** ondansetron (ZOFRAN) IV, mouth rinse

## 2022-11-12 NOTE — ED Triage Notes (Addendum)
Pt presents to ED POV from [facility]. Pt c/o malaise, nausea, and decrease appetite x1w. Pt told by PCP to come here d/t labs indicated that she was dehydrated. Reports negative covid test x2. Other residents in SNF w/ same s/s

## 2022-11-12 NOTE — Progress Notes (Signed)
Plan of Care Note for accepted transfer   Patient: Yvonne Fry MRN: [id number]   DOA: 11/12/2022  Facility requesting transfer: [facility]. Requesting Provider: Derwood Kaplan MD Reason for transfer: Hyponatremia. Facility course:  89 year old female referred by her PCP to the emergency department due to hyponatremia.  She has been having some diarrhea.  No diuretics or behavioral health meds which could be the etiology of this.  Case discussed with nephrology, Dr. Kathrene Bongo who will see the patient in consult.  No need for hypertonic saline given mild symptoms.  Basic metabolic panel [[id number]] (Abnormal)   Collected: 11/12/22 1131   Updated: 11/12/22 1212   Specimen Type: Blood   Specimen Source: Vein    Sodium 116 Low Panic  mmol/L   Potassium 4.0 mmol/L   Chloride 84 Low  mmol/L   CO2 24 mmol/L   Glucose, Bld 100 High  mg/dL   BUN 9 mg/dL   Creatinine, Ser 5.95 mg/dL   Calcium 8.4 Low  mg/dL   GFR, Estimated >63 mL/min   Anion gap 8  CBC with Differential [[id number]]   Collected: 11/12/22 1131   Updated: 11/12/22 1135   Specimen Type: Blood   Specimen Source: Vein    WBC 7.4 K/uL   RBC 4.46 MIL/uL   Hemoglobin 13.5 g/dL   HCT 51.8 %   MCV 84.1 fL   MCH 30.3 pg   MCHC 34.5 g/dL   RDW 66.0 %   Platelets 373 K/uL   nRBC 0.0 %   Neutrophils Relative % 71 %   Neutro Abs 5.2 K/uL   Lymphocytes Relative 18 %   Lymphs Abs 1.4 K/uL   Monocytes Relative 10 %   Monocytes Absolute 0.7 K/uL   Eosinophils Relative 1 %   Eosinophils Absolute 0.1 K/uL   Basophils Relative 0 %   Basophils Absolute 0.0 K/uL   Immature Granulocytes 0 %   Abs Immature Granulocytes 0.03 K/uL    Plan of care: The patient is accepted for admission to [facility] unit, at [facility]..  Author: Bobette Mo, MD 11/12/2022  Check [email or web address] for on-call coverage.  Nursing staff, Please call [facility] Admits & Consults System-Wide number on Amion as soon as patient's arrival, so  appropriate admitting provider can evaluate the pt.

## 2022-11-12 NOTE — ED Provider Notes (Signed)
[facility] EMERGENCY DEPARTMENT AT [facility] Provider Note   CSN: [id number] Arrival date & time: 11/12/22  1052     History  Chief Complaint  Patient presents with   Fatigue    Yvonne Fry is a 89 y.o. female.  HPI    89 year old female comes in with chief complaint of generalized weakness. Patient resides at independent living facility.  She has history of hypertension.  She states that over the last few days she has had generalized malaise, nausea.  The facility where she resides has completed 2 separate COVID test, which were negative.  She has mild cough, they advised that she come to the emergency room, because of fear dehydration.  Patient denies any severe headache, neck pain, one-sided weakness, numbness, chest pain, shortness of breath, rashes.  She has nausea without vomiting.  She has reduced p.o. intake.  She also indicates 1-2 loose bowel movements over the last few days.  No new medications.  Patient's son is at the bedside, he also provides collateral history.  Home Medications Prior to Admission medications   Medication Sig Start Date End Date Taking? Authorizing Provider  acetaminophen (TYLENOL) 500 MG tablet Take 1,000 mg by mouth every 8 (eight) hours as needed.   Yes [provider]  cetirizine (ZYRTEC) 10 MG tablet Take 10 mg by mouth daily. 06/17/21  Yes [provider]  cholecalciferol (VITAMIN D) 25 MCG (1000 UNIT) tablet Take 1,000 Units by mouth daily.   Yes [provider]  denosumab (PROLIA) 60 MG/ML SOSY injection Inject 60 mg into the skin every 6 (six) months.   Yes [provider]  melatonin 3 MG TABS tablet Take 3 mg by mouth at bedtime.   Yes [provider]  Multiple Vitamins-Minerals (MULTIVITAMIN PO) Take by mouth daily.   Yes [provider]  Multiple Vitamins-Minerals (PRESERVISION AREDS 2) CAPS Take by mouth 2 (two) times daily.   Yes [provider]  MYRBETRIQ 25  MG TB24 tablet Take 1 tablet by mouth daily. 06/25/21  Yes [provider]  olmesartan (BENICAR) 40 MG tablet Take 40 mg by mouth daily. 06/25/21  Yes [provider]  ondansetron (ZOFRAN) 4 MG tablet Take 4 mg by mouth. 11/11/22  Yes [provider]  Polyethyl Glycol-Propyl Glycol (SYSTANE ULTRA) 0.4-0.3 % SOLN Apply to eye.   Yes [provider]  Polyethyl Glycol-Propyl Glycol (SYSTANE) 0.4-0.3 % GEL ophthalmic gel Place 1 application into both eyes.   Yes [provider]  triamcinolone (KENALOG) 0.1 % Apply 1 application topically 2 (two) times daily.   Yes [provider]  COVID-19 mRNA vaccine, Moderna, 100 MCG/0.5ML injection Inject into the muscle. 12/13/20   Judyann Munson, MD  influenza vaccine adjuvanted (FLUAD QUADRIVALENT) 0.5 ML injection Inject into the muscle. 01/19/22   Judyann Munson, MD      Allergies    Neosporin [neomycin-bacitracin zn-polymyx]    Review of Systems   Review of Systems  All other systems reviewed and are negative.   Physical Exam Updated Vital Signs BP (!) 181/98   Pulse 65   Temp 98 F (36.7 C) (Oral)   Resp 18   SpO2 100%  Physical Exam Vitals and nursing note reviewed.  Constitutional:      Appearance: She is well-developed.  HENT:     Head: Atraumatic.  Eyes:     Extraocular Movements: Extraocular movements intact.     Pupils: Pupils are equal, round, and reactive to light.  Comments: No nystagmus  Cardiovascular:     Rate and Rhythm: Normal rate.  Pulmonary:     Effort: Pulmonary effort is normal.  Musculoskeletal:     Cervical back: Normal range of motion and neck supple.  Skin:    General: Skin is warm and dry.  Neurological:     Mental Status: She is alert and oriented to person, place, and time.     ED Results / Procedures / Treatments   Labs (all labs ordered are listed, but only abnormal results are displayed) Labs Reviewed  BASIC METABOLIC PANEL - Abnormal; Notable  for the following components:      Result Value   Sodium 116 (*)    Chloride 84 (*)    Glucose, Bld 100 (*)    Calcium 8.4 (*)    All other components within normal limits  URINALYSIS, ROUTINE W REFLEX MICROSCOPIC - Abnormal; Notable for the following components:   Ketones, ur 15 (*)    Protein, ur 30 (*)    Leukocytes,Ua TRACE (*)    All other components within normal limits  PHOSPHORUS - Abnormal; Notable for the following components:   Phosphorus 2.4 (*)    All other components within normal limits  CBC WITH DIFFERENTIAL/PLATELET  MAGNESIUM  OSMOLALITY  OSMOLALITY, URINE  SODIUM, URINE, RANDOM    EKG EKG Interpretation Date/Time:  Thursday November 12 2022 12:42:38 EDT Ventricular Rate:  65 PR Interval:  188 QRS Duration:  91 QT Interval:  441 QTC Calculation: 459 R Axis:   -36  Text Interpretation: Sinus rhythm Left axis deviation Borderline low voltage, extremity leads No acute changes No significant change since last tracing Confirmed by Derwood Kaplan [phone]) on 11/12/2022 1:07:00 PM  Radiology No results found.  Procedures .Critical Care  Performed by: Derwood Kaplan, MD Authorized by: Derwood Kaplan, MD   Critical care provider statement:    Critical care time (minutes):  48   Critical care was necessary to treat or prevent imminent or life-threatening deterioration of the following conditions:  Metabolic crisis   Critical care was time spent personally by me on the following activities:  Development of treatment plan with patient or surrogate, discussions with consultants, evaluation of patient's response to treatment, examination of patient, ordering and review of laboratory studies, ordering and review of radiographic studies, ordering and performing treatments and interventions, pulse oximetry, re-evaluation of patient's condition, review of old charts and obtaining history from patient or surrogate     Medications Ordered in ED Medications  0.9 %  sodium  chloride infusion (has no administration in time range)  sodium chloride 0.9 % bolus 500 mL (0 mLs Intravenous Stopped 11/12/22 1343)    ED Course/ Medical Decision Making/ A&P                             Medical Decision Making Amount and/or Complexity of Data Reviewed Labs: ordered.  Risk Decision regarding hospitalization.   This patient presents to the ED with chief complaint(s) of generalized weak nests, questionable dehydration with pertinent past medical history of hypertension and negative COVID-19 test.The complaint involves an extensive differential diagnosis and also carries with it a high risk of complications and morbidity.    The differential diagnosis includes : COVID-19 related complications, severe electrolyte abnormality, AKI, uremia, severe dehydration, cystitis, medication side effects  The initial plan is to do basic labs and then reassess the patient.  Additional history obtained: Additional history obtained  from family Records reviewed  patient's medications.  There is no HCTZ, antipsychotic or SSRI.  No medications that are commonly associated with hyponatremia  Independent labs interpretation:  The following labs were independently interpreted: Patient found to have sodium of 116.  BUN/creatinine ratio is reassuring.  No evidence of renal failure.  Treatment and Reassessment: Results of the ED workup discussed with the patient and the son.  We will get serum and urine osm.  Patient appeared slightly dry during initial assessment, therefore we will give her finer cc fluid bolus.  Consultation: - Consulted or discussed management/test interpretation with external professional: Nephrology.  I discussed with him that patient has sodium of 116.  I discussed with them that patient is having malaise, nausea, anorexia as likely the symptoms because of the hyponatremia.  I discussed with him specifically that patient has no altered mental status, seizure-like activity  and her balance appears to be at baseline.  Dr. Kathrene Bongo indicates that they will see the patient, but no need to start hypertonic saline at this time.    Final Clinical Impression(s) / ED Diagnoses Final diagnoses:  Acute hyponatremia    Rx / DC Orders ED Discharge Orders     None         Derwood Kaplan, MD 11/12/22 1351

## 2022-11-12 NOTE — H&P (Signed)
History and Physical    Patient: Yvonne Fry BMW:[id number] DOB: May 25, 1928 DOA: 11/12/2022 DOS: the patient was seen and examined on 11/12/2022 PCP: [name], MD  Patient coming from: Home  Chief Complaint:  Chief Complaint  Patient presents with   Fatigue   HPI: Yvonne Fry is a 89 y.o. female with medical history significant of diverticulosis, hemorrhoids, microscopic hematuria, hypertension, osteopenia who presented to the emergency department referred by her PCP with abnormal labs.  She saw her PCP due to generalized weakness.  She thought she caught a virus as she has been having multiple episodes of diarrhea since last week.  COVID test negative x 2.  No travel history or sick contacts.  She does not suspect of anything that she ate as the possible etiology for her diarrhea.  Does not use diuretics or other hyponatremia inducing medications.  She denied fever, chills, rhinorrhea, sore throat, wheezing or hemoptysis.  No chest pain, palpitations, diaphoresis, PND, orthopnea or pitting edema of the lower extremities.  No abdominal pain,  emesis, constipation, melena or hematochezia.  No flank pain, dysuria, frequency or hematuria.  No polyuria, polydipsia, polyphagia or blurred vision.   Lab work: Urinalysis showed ketones of 15 and protein of 30 mg deciliter.  There was trace leukocyte Estrace.  CBC was normal.  BMP showed a sodium 116, potassium 4.0, chloride 84 and CO2 24 mmol/L.  Renal function was normal.  Glucose was 100 and calcium 8.4 mg/dL.  Magnesium was 1.8 and phosphorus 2.4 mg/dL.  Osmolality was 254.  ED course: Initial vital signs were temperature 98 F, pulse 63, respiration 18, BP 187/94 mmHg O2 sat 99% on room air.  The patient received 500 mL of normal saline bolus.   Review of Systems: As mentioned in the history of present illness. All other systems reviewed and are negative. Past Medical History:  Diagnosis Date   Diverticulosis    and hemorrhoids    Hematuria, microscopic    Dr Gaspar Garbe negative   Hypertension    Osteopenia    Past Surgical History:  Procedure Laterality Date   APPENDECTOMY     COLONOSCOPY     hemorrhoids and diverticulosis   TOTAL ABDOMINAL HYSTERECTOMY     possible BSO   Social History:  reports that she has never smoked. She has never used smokeless tobacco. She reports current alcohol use. She reports that she does not use drugs.  Allergies  Allergen Reactions   Neosporin [Neomycin-Bacitracin Zn-Polymyx]     History reviewed. No pertinent family history.  Prior to Admission medications   Medication Sig Start Date End Date Taking? Authorizing Provider  acetaminophen (TYLENOL) 500 MG tablet Take 1,000 mg by mouth every 8 (eight) hours as needed.   Yes [provider]  cetirizine (ZYRTEC) 10 MG tablet Take 10 mg by mouth daily. 06/17/21  Yes [provider]  cholecalciferol (VITAMIN D) 25 MCG (1000 UNIT) tablet Take 1,000 Units by mouth daily.   Yes [provider]  denosumab (PROLIA) 60 MG/ML SOSY injection Inject 60 mg into the skin every 6 (six) months.   Yes [provider]  melatonin 3 MG TABS tablet Take 3 mg by mouth at bedtime.   Yes [provider]  Multiple Vitamins-Minerals (MULTIVITAMIN PO) Take by mouth daily.   Yes [provider]  Multiple Vitamins-Minerals (PRESERVISION AREDS 2) CAPS Take by mouth 2 (two) times daily.   Yes [provider]  MYRBETRIQ 25 MG TB24 tablet Take 1 tablet  by mouth daily. 06/25/21  Yes [provider]  olmesartan (BENICAR) 40 MG tablet Take 40 mg by mouth daily. 06/25/21  Yes [provider]  ondansetron (ZOFRAN) 4 MG tablet Take 4 mg by mouth. 11/11/22  Yes [provider]  Polyethyl Glycol-Propyl Glycol (SYSTANE ULTRA) 0.4-0.3 % SOLN Apply to eye.   Yes [provider]  Polyethyl Glycol-Propyl Glycol (SYSTANE) 0.4-0.3 % GEL ophthalmic gel Place 1 application into  both eyes.   Yes [provider]  triamcinolone (KENALOG) 0.1 % Apply 1 application topically 2 (two) times daily.   Yes [provider]  COVID-19 mRNA vaccine, Moderna, 100 MCG/0.5ML injection Inject into the muscle. 12/13/20   Judyann Munson, MD  influenza vaccine adjuvanted (FLUAD QUADRIVALENT) 0.5 ML injection Inject into the muscle. 01/19/22   Judyann Munson, MD    Physical Exam: Vitals:   11/12/22 1200 11/12/22 1235 11/12/22 1300 11/12/22 1330  BP: (!) 173/95 (!) 188/92 (!) 181/98 (!) 191/89  Pulse: 63 65 65 69  Resp: (!) 21 20 18  (!) 21  Temp:      TempSrc:      SpO2: 100% 99% 100% 100%   Physical Exam Vitals and nursing note reviewed.  Constitutional:      General: She is awake. She is not in acute distress. HENT:     Head: Normocephalic.     Nose: No rhinorrhea.  Eyes:     General: No scleral icterus.    Pupils: Pupils are equal, round, and reactive to light.  Neck:     Vascular: No JVD.  Cardiovascular:     Rate and Rhythm: Normal rate and regular rhythm.     Heart sounds: S1 normal and S2 normal.  Pulmonary:     Effort: Pulmonary effort is normal.     Breath sounds: Normal breath sounds.  Abdominal:     General: Bowel sounds are increased.     Palpations: Abdomen is soft.     Tenderness: There is no abdominal tenderness. There is left CVA tenderness. There is no right CVA tenderness.  Musculoskeletal:     Cervical back: Neck supple.     Right lower leg: No edema.     Left lower leg: No edema.  Skin:    General: Skin is warm and dry.  Neurological:     General: No focal deficit present.     Mental Status: She is alert and oriented to person, place, and time.  Psychiatric:        Mood and Affect: Mood normal.        Behavior: Behavior normal. Behavior is cooperative.     Data Reviewed:  Results are pending, will review when available.  Assessment and Plan: Principal Problem:   Hyponatremia SDU/inpatient. Unfortunately sodium is  still decreasing. Switched to hypertonic saline by nephrology. Monitor sodium closely. Will check TSH and cortisol level in AM. Check two-view chest radiograph to screen for lung masses. Nephrology consult greatly appreciated. Will follow their recommendations.  Active Problems:   Essential hypertension Continue olmesartan 40 mg daily or formulary equivalent Hydralazine 25 mg p.o. every 8 hours while in the hospital. Hydralazine 10 mg IVP every 4 hours as needed.    Hypophosphatemia Replacement given.    Hypocalcemia On Vitamin D supplementation. Check albumin level and correct calcium level as needed.     Advance Care Planning:   Code Status: Full Code   Consults: Nephrology Delano Metz, MD).  Family Communication: Her son was at bedside. Severity of  Illness: The appropriate patient status for this patient is INPATIENT. Inpatient status is judged to be reasonable and necessary in order to provide the required intensity of service to ensure the patient's safety. The patient's presenting symptoms, physical exam findings, and initial radiographic and laboratory data in the context of their chronic comorbidities is felt to place them at high risk for further clinical deterioration. Furthermore, it is not anticipated that the patient will be medically stable for discharge from the hospital within 2 midnights of admission.   * I certify that at the point of admission it is my clinical judgment that the patient will require inpatient hospital care spanning beyond 2 midnights from the point of admission due to high intensity of service, high risk for further deterioration and high frequency of surveillance required.*  Author: Bobette Mo, MD 11/12/2022 2:58 PM  For on call review [email or web address].   This document was prepared using Dragon voice recognition software and may contain some unintended transcription errors.

## 2022-11-12 NOTE — ED Notes (Addendum)
Yvonne Fry at [facility] called for equipment and to advise transport on the way. Bed Ready at [facility] 2W Room# 1232.-ABB(NS)

## 2022-11-12 NOTE — ED Notes (Signed)
Report given to [facility] 

## 2022-11-12 NOTE — ED Notes (Signed)
Pt aware of the need for a urine... Unable to currently provide the sample... 

## 2022-11-12 NOTE — ED Notes (Signed)
Report given to the floor RN.

## 2022-11-12 NOTE — ED Notes (Signed)
Spoke with lab to add on mag and phosporus

## 2022-11-12 NOTE — ED Notes (Signed)
[facility] informed of infusion rate and type of fluids... They were going to start it enroute to the [facility] hospital..Marland Kitchen

## 2022-11-13 ENCOUNTER — Inpatient Hospital Stay (HOSPITAL_COMMUNITY): Payer: Medicare Other

## 2022-11-13 DIAGNOSIS — E871 Hypo-osmolality and hyponatremia: Secondary | ICD-10-CM | POA: Diagnosis not present

## 2022-11-13 DIAGNOSIS — I10 Essential (primary) hypertension: Secondary | ICD-10-CM

## 2022-11-13 LAB — RESPIRATORY PANEL BY PCR

## 2022-11-13 LAB — PHOSPHORUS: Phosphorus: 3.1 mg/dL (ref 2.5–4.6)

## 2022-11-13 MED ORDER — MORPHINE SULFATE (PF) 2 MG/ML IV SOLN
0.5000 mg | Freq: Once | INTRAVENOUS | Status: AC
  Administered 2022-11-13: 0.5 mg via INTRAVENOUS
  Filled 2022-11-13: qty 1

## 2022-11-13 MED ORDER — SODIUM CHLORIDE 1 G PO TABS
2.0000 g | ORAL_TABLET | Freq: Three times a day (TID) | ORAL | Status: DC
  Administered 2022-11-13 – 2022-11-14 (×4): 2 g via ORAL
  Filled 2022-11-13 (×4): qty 2

## 2022-11-13 MED ORDER — HYDROXYZINE HCL 25 MG PO TABS
25.0000 mg | ORAL_TABLET | Freq: Once | ORAL | Status: AC
  Administered 2022-11-13: 25 mg via ORAL
  Filled 2022-11-13: qty 1

## 2022-11-13 MED ORDER — MUSCLE RUB 10-15 % EX CREA
TOPICAL_CREAM | CUTANEOUS | Status: DC | PRN
  Filled 2022-11-13: qty 85

## 2022-11-13 MED ORDER — SIMETHICONE 80 MG PO CHEW
80.0000 mg | CHEWABLE_TABLET | Freq: Four times a day (QID) | ORAL | Status: DC | PRN
  Administered 2022-11-13: 80 mg via ORAL
  Filled 2022-11-13: qty 1

## 2022-11-13 NOTE — Plan of Care (Signed)

## 2022-11-13 NOTE — Progress Notes (Signed)
PT Cancellation Note  Patient Details Name: Yvonne Fry MRN: [id number] DOB: 22-Apr-1928   Cancelled Treatment:    Reason Eval/Treat Not Completed: Other (comment) Checked on  x 2, just ambulated   each time, per Rn, multiple  walks to [place]. Will check back tomorrow. Blanchard Kelch PT Acute Rehabilitation Services Office [phone] Weekend [phone]   Yvonne Hay 11/13/2022, 3:45 PM

## 2022-11-13 NOTE — Progress Notes (Signed)
[facility] PROGRESS NOTE    Progress Note  Yvonne Fry  FIE:[id number] DOB: October 06, 1928 DOA: 11/12/2022 PCP: [name], MD     Brief Narrative:   Yvonne Fry is an 89 y.o. female past medical history significant for diverticulosis, hemorrhoids, microscopic hematuria comes into the ED for abnormal labs and complaining of malaise nausea and no appetite for several days was found to have a sodium of 116 in the ED was given IV fluids and her sodium got worse.  Nephrology was consulted admitted to the ICU and started on hypertonic saline.    Assessment/Plan:   Hyponatremia She is on no SSRI or thiazide diuretic.  She has no history of heart failure, liver dysfunction or renal dysfunction. Appears euvolemic on physical exam. Nephrology was consulted and started her on hypertonic saline with a goal of 10 mill equivalents for less than 24 hours. TSH 3.3 and cortisol in the morning Sodium this morning is 122. Further management per nephrology.  Essential hypertension Continue ACE inhibitor and hydralazine as needed.  Hypophosphatemia: Repeating orally, labs this morning are pending.  Hypocalcemia: Corrected for albumin unremarkable.   DVT prophylaxis: lovenox Family Communication:none Status is: Inpatient Remains inpatient appropriate because: Symptomatic hyponatremia    Code Status:     Code Status Orders  (From admission, onward)           Start     Ordered   11/12/22 1427  Full code  Continuous       Question:  By:  Answer:  Consent: discussion documented in EHR   11/12/22 1427           Code Status History     This patient has a current code status but no historical code status.      Advance Directive Documentation    Flowsheet Row Most Recent Value  Type of Advance Directive Healthcare Power of Attorney  Pre-existing out of facility DNR order (yellow form or pink MOST form) --  "MOST" Form in Place? --         IV Access:    Peripheral IV   Procedures and diagnostic studies:   DG Chest 2 View  Result Date: 11/12/2022 CLINICAL DATA:  SIADH EXAM: CHEST - 2 VIEW COMPARISON:  None Available. FINDINGS: Cardiac shadow is within normal limits. Aortic calcifications are noted. The lungs are clear bilaterally. No focal infiltrate or effusion is seen. Degenerative changes of the thoracic spine are seen with a lower thoracic compression deformity of uncertain chronicity. IMPRESSION: No acute abnormality noted. Electronically Signed   By: Alcide Clever M.D.   On: 11/12/2022 20:26     Medical Consultants:   None.   Subjective:    Yvonne Fry feels better appetite has returned.  Objective:    Vitals:   11/13/22 0338 11/13/22 0400 11/13/22 0500 11/13/22 0634  BP:  (!) 158/74  (!) 151/73  Pulse:  66    Resp:  12    Temp: 98 F (36.7 C)     TempSrc: Oral     SpO2:  96%    Weight:   52.2 kg   Height:       SpO2: 96 %   Intake/Output Summary (Last 24 hours) at 11/13/2022 0658 Last data filed at 11/13/2022 0500 Gross per 24 hour  Intake 972.56 ml  Output 820 ml  Net 152.56 ml   Filed Weights   11/12/22 1441 11/13/22 0500  Weight: 52.6 kg 52.2 kg    Exam: General  exam: In no acute distress. Respiratory system: Good air movement and clear to auscultation. Cardiovascular system: S1 & S2 heard, RRR.  Gastrointestinal system: Abdomen is nondistended, soft and nontender.  Extremities: No pedal edema. Skin: No rashes, lesions or ulcers Psychiatry: Judgement and insight appear normal. Mood & affect appropriate.    Data Reviewed:    Labs: Basic Metabolic Panel: Recent Labs  Lab 11/12/22 1131 11/12/22 1526 11/12/22 1855 11/12/22 2054 11/13/22 0100 11/13/22 0322  NA 116* 115* 118* 120* 121* 122*  K 4.0 3.9  --   --   --  3.4*  CL 84* 85*  --   --   --  91*  CO2 24 18*  --   --   --  20*  GLUCOSE 100* 95  --   --   --  87  BUN 9 7*  --   --   --  7*  CREATININE 0.61 0.51  --   --   --   0.51  CALCIUM 8.4* 7.7*  --   --   --  7.7*  MG 1.8  --   --   --   --   --   PHOS 2.4*  --   --   --   --   --    GFR Estimated Creatinine Clearance: 37.7 mL/min (by C-G formula based on SCr of 0.51 mg/dL). Liver Function Tests: Recent Labs  Lab 11/12/22 1526  AST 26  ALT 16  ALKPHOS 45  BILITOT 0.8  PROT 6.7  ALBUMIN 3.6   No results for input(s): "LIPASE", "AMYLASE" in the last 168 hours. No results for input(s): "AMMONIA" in the last 168 hours. Coagulation profile No results for input(s): "INR", "PROTIME" in the last 168 hours. COVID-19 Labs  No results for input(s): "DDIMER", "FERRITIN", "LDH", "CRP" in the last 72 hours.  Lab Results  Component Value Date   SARSCOV2NAA Not Detected 04/18/2019   SARSCOV2NAA Not Detected 03/22/2019   SARSCOV2NAA Not Detected 12/21/2018    CBC: Recent Labs  Lab 11/12/22 1131 11/13/22 0321  WBC 7.4 7.8  NEUTROABS 5.2  --   HGB 13.5 13.3  HCT 39.1 39.0  MCV 87.7 88.8  PLT 373 396   Cardiac Enzymes: No results for input(s): "CKTOTAL", "CKMB", "CKMBINDEX", "TROPONINI" in the last 168 hours. BNP (last 3 results) No results for input(s): "PROBNP" in the last 8760 hours. CBG: No results for input(s): "GLUCAP" in the last 168 hours. D-Dimer: No results for input(s): "DDIMER" in the last 72 hours. Hgb A1c: No results for input(s): "HGBA1C" in the last 72 hours. Lipid Profile: No results for input(s): "CHOL", "HDL", "LDLCALC", "TRIG", "CHOLHDL", "LDLDIRECT" in the last 72 hours. Thyroid function studies: Recent Labs    11/13/22 0321  TSH 3.131   Anemia work up: No results for input(s): "VITAMINB12", "FOLATE", "FERRITIN", "TIBC", "IRON", "RETICCTPCT" in the last 72 hours. Sepsis Labs: Recent Labs  Lab 11/12/22 1131 11/13/22 0321  WBC 7.4 7.8   Microbiology Recent Results (from the past 240 hour(s))  MRSA Next Gen by PCR, Nasal     Status: None   Collection Time: 11/12/22  3:05 PM   Specimen: Nasal Mucosa; Nasal Swab   Result Value Ref Range Status   MRSA by PCR Next Gen NOT DETECTED NOT DETECTED Final    Comment: (NOTE) The GeneXpert MRSA Assay (FDA approved for NASAL specimens only), is one component of a comprehensive MRSA colonization surveillance program. It is not intended to diagnose MRSA infection  nor to guide or monitor treatment for MRSA infections. Test performance is not FDA approved in patients less than 28 years old. Performed at [facility], [address] [place], [place] [address]      Medications:    Chlorhexidine Gluconate Cloth  6 each Topical Daily   enoxaparin (LOVENOX) injection  40 mg Subcutaneous Q24H   hydrALAZINE  25 mg Oral Q8H   irbesartan  300 mg Oral Daily   melatonin  5 mg Oral QHS   phosphorus  500 mg Oral BID   Continuous Infusions:  sodium chloride (hypertonic) 19 mL/hr at 11/13/22 0400      LOS: 1 day   Marinda Elk  [facility]  11/13/2022, 6:58 AM

## 2022-11-13 NOTE — Progress Notes (Signed)
[facility] Progress Note  Subjective: seen in room, Na up to 123 this am. Feeling better, ate solid food for the 1st time in about a week.   Vitals:   11/13/22 0500 11/13/22 0634 11/13/22 0700 11/13/22 0800  BP:  (!) 151/73  137/74  Pulse:    77  Resp:    14  Temp:   97.9 F (36.6 C)   TempSrc:   Oral   SpO2:    96%  Weight: 52.2 kg     Height:        Exam: Gen alert, elderly, pleasant lady  No jvd or bruits Chest clear bilat to bases RRR no RG Abd soft ntnd no mass or ascites +bs Ext no LE edema Neuro is alert, Ox 3 , nf    Home meds include - vit D, zyrtec, tylenol, prolia, melatonin, mvi, preservision, myrbetriq, olmesartan, ondansetron, systane gtts, triamcinolone     Assessment/ Plan: Hyponatremia - Na+ 116 in elderly pleasant lady w/ not a lot of medical problems. Feeling bad for 3-4 days w/ nausea and malaise, no vomiting/ diarrhea. Meds reviewed, no thiazides/ SSRI's, is on an ARB. No hx or signs of CHF/ liver / renal failure. No hx thyroid disease. UNa 63 and UOsm 440. 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 Cause is not clear, CXR negative, will need to get a head CT also (recent fall). TSH/ cortisol were wnl. Other possible short-term causes could be - (1) recent viral URI (covid neg), (2) recent nausea which is known to raise ADH levels and /or (3) ARB effects. For treatment we started 3% saline yesterday 7/25 and Na+ today is up to 123 from 116. Cont Na+ levels q 3-4 hrs and 3% at 19 cc/hr, when 125 or higher will dc 3% and start salt tabs w/ fluid restriction.   HTN - on losartan, will hold for now. Substituted hydralazine 25 tid and BP's stable. Would keep off arb/ acei's for now.     Vinson Moselle MD  [facility] 11/13/2022, 9:30 AM  Recent Labs  Lab 11/12/22 1131 11/12/22 1526 11/13/22 0321 11/13/22 0322 11/13/22 0815  HGB 13.5  --  13.3  --   --   ALBUMIN  --  3.6  --   --   --   CALCIUM 8.4* 7.7*  --  7.7*  --   PHOS 2.4*  --   --   --  3.1  CREATININE 0.61  0.51  --  0.51  --   K 4.0 3.9  --  3.4*  --    No results for input(s): "IRON", "TIBC", "FERRITIN" in the last 168 hours. Inpatient medications:  Chlorhexidine Gluconate Cloth  6 each Topical Daily   enoxaparin (LOVENOX) injection  40 mg Subcutaneous Q24H   hydrALAZINE  25 mg Oral Q8H   irbesartan  300 mg Oral Daily   melatonin  5 mg Oral QHS   phosphorus  500 mg Oral BID    sodium chloride (hypertonic) 19 mL/hr at 11/13/22 0400   acetaminophen **OR** acetaminophen, hydrALAZINE, Muscle Rub, ondansetron **OR** ondansetron (ZOFRAN) IV, mouth rinse, polyvinyl alcohol

## 2022-11-14 ENCOUNTER — Inpatient Hospital Stay (HOSPITAL_COMMUNITY): Payer: Medicare Other

## 2022-11-14 DIAGNOSIS — I10 Essential (primary) hypertension: Secondary | ICD-10-CM | POA: Diagnosis not present

## 2022-11-14 DIAGNOSIS — E871 Hypo-osmolality and hyponatremia: Secondary | ICD-10-CM | POA: Diagnosis not present

## 2022-11-14 LAB — SODIUM
Sodium: 124 mmol/L — ABNORMAL LOW (ref 135–145)
Sodium: 126 mmol/L — ABNORMAL LOW (ref 135–145)
Sodium: 126 mmol/L — ABNORMAL LOW (ref 135–145)
Sodium: 126 mmol/L — ABNORMAL LOW (ref 135–145)

## 2022-11-14 MED ORDER — MORPHINE SULFATE (PF) 2 MG/ML IV SOLN: 1.0000 mg | INTRAVENOUS | Status: DC | PRN

## 2022-11-14 NOTE — Progress Notes (Signed)
[facility] Progress Note  Subjective: seen in room, Na up to 126 today. Was not following fluid restriction overnight. Appetite is sig better. Nausea gone.   Vitals:   11/14/22 1000 11/14/22 1100 11/14/22 1200 11/14/22 1228  BP: (!) 164/81 (!) 150/79 (!) 109/95   Pulse: 89 87 82   Resp: 12 10 17    Temp:   98.7 F (37.1 C) 98.7 F (37.1 C)  TempSrc:   Oral Oral  SpO2: 97% 96% 98%   Weight:      Height:        Exam: Gen alert, elderly, pleasant lady  No jvd or bruits Chest clear bilat to bases RRR no RG Abd soft ntnd no mass or ascites +bs Ext no LE edema Neuro is alert, Ox 3 , nf    Home meds include - vit D, zyrtec, tylenol, prolia, melatonin, mvi, preservision, myrbetriq, olmesartan, ondansetron, systane gtts, triamcinolone     Assessment/ Plan: Hyponatremia - Na+ 116 in elderly pleasant lady w/ not a lot of medical problems. Feeling bad for 3-4 days w/ nausea and malaise, no vomiting/ diarrhea. Meds reviewed, no thiazides/ SSRI's, is on an ARB. No hx or signs of CHF/ liver / renal failure. No hx thyroid disease. UNa 63 and UOsm 440. 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 Cause is not clear. CXR and CT head were negative. TSH/ cortisol were wnl. Other possible short-term causes could be (1) recent viral URI,  (2) recent nausea and /or (3) ARB effects. 3% saline started 7/25 and will be dc'd today 7/27. Na+ up to 126. Starting salt tabs and 1000cc fluid restriction. Will follow.  HTN - on losartan, will hold for now. Substituted hydralazine 25 tid and BP's stable. Would keep off arb/ acei's for now.     Vinson Moselle MD  [facility] 11/14/2022, 2:18 PM  Recent Labs  Lab 11/12/22 1131 11/12/22 1526 11/13/22 0321 11/13/22 0322 11/13/22 0815  HGB 13.5  --  13.3  --   --   ALBUMIN  --  3.6  --   --   --   CALCIUM 8.4* 7.7*  --  7.7*  --   PHOS 2.4*  --   --   --  3.1  CREATININE 0.61 0.51  --  0.51  --   K 4.0 3.9  --  3.4*  --    No results for input(s): "IRON", "TIBC",  "FERRITIN" in the last 168 hours. Inpatient medications:  Chlorhexidine Gluconate Cloth  6 each Topical Daily   enoxaparin (LOVENOX) injection  40 mg Subcutaneous Q24H   hydrALAZINE  25 mg Oral Q8H   melatonin  5 mg Oral QHS   sodium chloride  2 g Oral TID WC     acetaminophen **OR** acetaminophen, hydrALAZINE, morphine injection, Muscle Rub, ondansetron **OR** ondansetron (ZOFRAN) IV, mouth rinse, polyvinyl alcohol, simethicone

## 2022-11-14 NOTE — Progress Notes (Signed)
[facility] PROGRESS NOTE    Progress Note  Yvonne Fry  OZH:[id number] DOB: 30-Apr-1928 DOA: 11/12/2022 PCP: [name], MD     Brief Narrative:   Yvonne Fry is an 89 y.o. female past medical history significant for diverticulosis, hemorrhoids, microscopic hematuria comes into the ED for abnormal labs and complaining of malaise nausea and no appetite for several days was found to have a sodium of 116 in the ED was given IV fluids and her sodium got worse.  Nephrology was consulted admitted to the ICU and started on hypertonic saline.   Assessment/Plan:   Hyponatremia: Appears euvolemic on physical exam. Nephrology was consulted and started on hypertonic saline her sodium is improving slowly. TSH 3.3 and cortisol in the morning CT of the head showed extensive periventricular hypoattenuation nonspecific.  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 Respiratory panel was negative. Further management per nephrology.  Nephrology is leaning more to SIADH although no clear cause. PT OT eval  Essential hypertension Hold ACE ACE inhibitor and use hydralazine as needed.  Hypophosphatemia: Repeating orally, now improved.  Hypocalcemia: Corrected for albumin unremarkable.   DVT prophylaxis: lovenox Family Communication:none Status is: Inpatient Remains inpatient appropriate because: Symptomatic hyponatremia    Code Status:     Code Status Orders  (From admission, onward)           Start     Ordered   11/12/22 1427  Full code  Continuous       Question:  By:  Answer:  Consent: discussion documented in EHR   11/12/22 1427           Code Status History     This patient has a current code status but no historical code status.      Advance Directive Documentation    Flowsheet Row Most Recent Value  Type of Advance Directive Healthcare Power of Attorney  Pre-existing out of facility DNR order (yellow form or pink MOST form) --  "MOST" Form in  Place? --         IV Access:   Peripheral IV   Procedures and diagnostic studies:   CT HEAD WO CONTRAST ( )  Result Date: 11/13/2022 CLINICAL DATA:  Headache, new onset (Age >= 51y) EXAM: CT HEAD WITHOUT CONTRAST TECHNIQUE: Contiguous axial images were obtained from the base of the skull through the vertex without intravenous contrast. RADIATION DOSE REDUCTION: This exam was performed according to the departmental dose-optimization program which includes automated exposure control, adjustment of the mA and/or kV according to patient size and/or use of iterative reconstruction technique. COMPARISON:  None Available. FINDINGS: Brain: There is extensive periventricular hypoattenuation, which is nonspecific, and could represent sequela of severe chronic microvascular ischemic change. The left lentiform nucleus is difficult to visualize (series 3, image 16). There is an age indeterminate infarct in the right thalamus (series 5, image 25). No hydrocephalus. No extra-axial fluid collection. Vascular: No disproportionately hyperdense vessel or unexpected calcification. Skull: Normal. Negative for fracture or focal lesion. Sinuses/Orbits: No middle ear or mastoid effusion. Paranasal sinuses are clear. Orbits are notable for bilateral lens replacement, otherwise unremarkable. Other: None. IMPRESSION: 1. The left lentiform nucleus is difficult to visualize. 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 Recommend further evaluation with a brain MRI. 2. 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 Attention on MRI. 3. Extensive periventricular hypoattenuation, which is nonspecific, and could represent sequela of severe chronic microvascular ischemic change. These results will  be called to the ordering clinician or representative by the Radiologist Assistant, and communication documented in the PACS or [employer]. Electronically Signed   By: Lorenza Cambridge M.D.   On: 11/13/2022 15:21   DG Chest 2 View  Result Date: 11/12/2022 CLINICAL DATA:  SIADH EXAM: CHEST - 2 VIEW COMPARISON:  None Available. FINDINGS: Cardiac shadow is within normal limits. Aortic calcifications are noted. The lungs are clear bilaterally. No focal infiltrate or effusion is seen. Degenerative changes of the thoracic spine are seen with a lower thoracic compression deformity of uncertain chronicity. IMPRESSION: No acute abnormality noted. Electronically Signed   By: Alcide Clever M.D.   On: 11/12/2022 20:26     Medical Consultants:   None.   Subjective:    Yvonne Fry diet is improving really thirsty.  Objective:    Vitals:   11/14/22 0300 11/14/22 0408 11/14/22 0605 11/14/22 0700  BP:   (!) 163/68 (!) 135/58  Pulse:    74  Resp:    15  Temp: 97.6 F (36.4 C)     TempSrc: Axillary     SpO2:    95%  Weight:  53 kg    Height:       SpO2: 95 %   Intake/Output Summary (Last 24 hours) at 11/14/2022 0753 Last data filed at 11/14/2022 0100 Gross per 24 hour  Intake 2131.45 ml  Output 75 ml  Net 2056.45 ml   Filed Weights   11/12/22 1441 11/13/22 0500 11/14/22 0408  Weight: 52.6 kg 52.2 kg 53 kg    Exam: General exam: In no acute distress. Respiratory system: Good air movement and clear to auscultation. Cardiovascular system: S1 & S2 heard, RRR. No JVD. Gastrointestinal system: Abdomen is nondistended, soft and nontender.  Extremities: No pedal edema. Skin: No rashes, lesions or ulcers Psychiatry: Judgement and insight appear normal. Mood & affect appropriate.  Data Reviewed:    Labs: Basic Metabolic Panel: Recent Labs  Lab 11/12/22 1131 11/12/22 1526 11/12/22 1855 11/13/22 0322 11/13/22 0815 11/13/22 1049 11/13/22 1449 11/13/22 1836 11/13/22 2338 11/14/22 0322  NA 116* 115*   < > 122* 123* 122* 122* 125* 126* 126*  K 4.0 3.9  --  3.4*  --   --   --   --   --   --   CL 84* 85*  --   91*  --   --   --   --   --   --   CO2 24 18*  --  20*  --   --   --   --   --   --   GLUCOSE 100* 95  --  87  --   --   --   --   --   --   BUN 9 7*  --  7*  --   --   --   --   --   --   CREATININE 0.61 0.51  --  0.51  --   --   --   --   --   --   CALCIUM 8.4* 7.7*  --  7.7*  --   --   --   --   --   --   MG 1.8  --   --   --   --   --   --   --   --   --   PHOS 2.4*  --   --   --  3.1  --   --   --   --   --    < > =  values in this interval not displayed.   GFR Estimated Creatinine Clearance: 38.3 mL/min (by C-G formula based on SCr of 0.51 mg/dL). Liver Function Tests: Recent Labs  Lab 11/12/22 1526  AST 26  ALT 16  ALKPHOS 45  BILITOT 0.8  PROT 6.7  ALBUMIN 3.6   No results for input(s): "LIPASE", "AMYLASE" in the last 168 hours. No results for input(s): "AMMONIA" in the last 168 hours. Coagulation profile No results for input(s): "INR", "PROTIME" in the last 168 hours. COVID-19 Labs  No results for input(s): "DDIMER", "FERRITIN", "LDH", "CRP" in the last 72 hours.  Lab Results  Component Value Date   SARSCOV2NAA Not Detected 04/18/2019   SARSCOV2NAA Not Detected 03/22/2019   SARSCOV2NAA Not Detected 12/21/2018    CBC: Recent Labs  Lab 11/12/22 1131 11/13/22 0321  WBC 7.4 7.8  NEUTROABS 5.2  --   HGB 13.5 13.3  HCT 39.1 39.0  MCV 87.7 88.8  PLT 373 396   Cardiac Enzymes: No results for input(s): "CKTOTAL", "CKMB", "CKMBINDEX", "TROPONINI" in the last 168 hours. BNP (last 3 results) No results for input(s): "PROBNP" in the last 8760 hours. CBG: No results for input(s): "GLUCAP" in the last 168 hours. D-Dimer: No results for input(s): "DDIMER" in the last 72 hours. Hgb A1c: No results for input(s): "HGBA1C" in the last 72 hours. Lipid Profile: No results for input(s): "CHOL", "HDL", "LDLCALC", "TRIG", "CHOLHDL", "LDLDIRECT" in the last 72 hours. Thyroid function studies: Recent Labs    11/13/22 0321  TSH 3.131   Anemia work up: No results for  input(s): "VITAMINB12", "FOLATE", "FERRITIN", "TIBC", "IRON", "RETICCTPCT" in the last 72 hours. Sepsis Labs: Recent Labs  Lab 11/12/22 1131 11/13/22 0321  WBC 7.4 7.8   Microbiology Recent Results (from the past 240 hour(s))  MRSA Next Gen by PCR, Nasal     Status: None   Collection Time: 11/12/22  3:05 PM   Specimen: Nasal Mucosa; Nasal Swab  Result Value Ref Range Status   MRSA by PCR Next Gen NOT DETECTED NOT DETECTED Final    Comment: (NOTE) The GeneXpert MRSA Assay (FDA approved for NASAL specimens only), is one component of a comprehensive MRSA colonization surveillance program. It is not intended to diagnose MRSA infection nor to guide or monitor treatment for MRSA infections. Test performance is not FDA approved in patients less than 5 years old. Performed at [facility], [address], [place], [place] [address]   C Difficile Quick Screen w PCR reflex     Status: None   Collection Time: 11/13/22  2:14 PM   Specimen: STOOL  Result Value Ref Range Status   C Diff antigen NEGATIVE NEGATIVE Final   C Diff toxin NEGATIVE NEGATIVE Final   C Diff interpretation No C. difficile detected.  Final    Comment: Performed at [facility], [address] [place], [place] [address]  Respiratory (~20 pathogens) panel by PCR     Status: None   Collection Time: 11/13/22  2:14 PM   Specimen: Nasopharyngeal Swab; Respiratory  Result Value Ref Range Status   Adenovirus NOT DETECTED NOT DETECTED Final   Coronavirus 229E NOT DETECTED NOT DETECTED Final    Comment: (NOTE) The Coronavirus on the Respiratory Panel, DOES NOT test for the novel  Coronavirus (2019 nCoV)    Coronavirus HKU1 NOT DETECTED NOT DETECTED Final   Coronavirus NL63 NOT DETECTED NOT DETECTED Final   Coronavirus OC43 NOT DETECTED NOT DETECTED Final   Metapneumovirus NOT DETECTED  NOT DETECTED Final   Rhinovirus / Enterovirus NOT DETECTED NOT DETECTED Final   Influenza A NOT  DETECTED NOT DETECTED Final   Influenza B NOT DETECTED NOT DETECTED Final   Parainfluenza Virus 1 NOT DETECTED NOT DETECTED Final   Parainfluenza Virus 2 NOT DETECTED NOT DETECTED Final   Parainfluenza Virus 3 NOT DETECTED NOT DETECTED Final   Parainfluenza Virus 4 NOT DETECTED NOT DETECTED Final   Respiratory Syncytial Virus NOT DETECTED NOT DETECTED Final   Bordetella pertussis NOT DETECTED NOT DETECTED Final   Bordetella Parapertussis NOT DETECTED NOT DETECTED Final   Chlamydophila pneumoniae NOT DETECTED NOT DETECTED Final   Mycoplasma pneumoniae NOT DETECTED NOT DETECTED Final    Comment: Performed at [facility], [address], [place], [place] [address]     Medications:    Chlorhexidine Gluconate Cloth  6 each Topical Daily   enoxaparin (LOVENOX) injection  40 mg Subcutaneous Q24H   hydrALAZINE  25 mg Oral Q8H   melatonin  5 mg Oral QHS   sodium chloride  2 g Oral TID WC   Continuous Infusions:  sodium chloride (hypertonic) 25 mL/hr at 11/14/22 0100      LOS: 2 days   Marinda Elk  [facility]  11/14/2022, 7:53 AM

## 2022-11-14 NOTE — Evaluation (Addendum)
Physical Therapy Evaluation Patient Details Name: TYJAE ZOTTOLA MRN: [id number] DOB: [birth year] Today's Date: 11/14/2022  History of Present Illness  Elberta Calia Lamonda is an 89 y.o. female past medical history significant for diverticulosis, hemorrhoids, microscopic hematuria comes into the ED for abnormal labs and complaining of malaise nausea and no appetite for several days was found to have a sodium of 116  Clinical Impression  Pt admitted with above diagnosis.  Pt currently with functional limitations due to the deficits listed below (see PT Problem List). Pt will benefit from acute skilled PT to increase their independence and safety with mobility to allow discharge.   The patient is eager to ambulate. Tolerated  x 300' using RW. Gait unsteady without support. Gait slow and halting for rest breaks intermittently. HR 114 max.  BP post ambulation 167/83.  Patient will benefit from continued inpatient follow up therapy, <3 hours/day, patient resides at [facility] . Patient resides independently and drives.         Assistance Recommended at Discharge Intermittent Supervision/Assistance  If plan is discharge home, recommend the following:  Can travel by private vehicle  A little help with walking and/or transfers;A little help with bathing/dressing/bathroom;Assistance with cooking/housework;Assist for transportation;Help with stairs or ramp for entrance   Yes    Equipment Recommendations None recommended by PT  Recommendations for Other Services       Functional Status Assessment Patient has had a recent decline in their functional status and demonstrates the ability to make significant improvements in function in a reasonable and predictable amount of time.     Precautions / Restrictions Precautions Precautions: Fall Precaution Comments: low Na+, also ? fluid restriction, says 1000 in  last order      Mobility  Bed Mobility               General bed mobility comments:  in recliner    Transfers Overall transfer level: Needs assistance Equipment used: Rolling walker (2 wheels) Transfers: Sit to/from Stand Sit to Stand: Supervision                Ambulation/Gait Ambulation/Gait assistance: Min guard Gait Distance (Feet): 300 Feet Assistive device: Rolling walker (2 wheels) Gait Pattern/deviations: Step-through pattern Gait velocity: decr     General Gait Details: gait steady,  and slow  Stairs            Wheelchair Mobility     Tilt Bed    Modified Rankin (Stroke Patients Only)       Balance Overall balance assessment: Mild deficits observed, not formally tested                                           Pertinent Vitals/Pain Pain Assessment Pain Assessment: Faces Faces Pain Scale: Hurts little more Pain Location: back Pain Descriptors / Indicators: Discomfort Pain Intervention(s): Monitored during session    Home Living Family/patient expects to be discharged to:: Private residence Living Arrangements: Alone   Type of Home: Apartment Home Access: Level entry       Home Layout: One level Home Equipment: Agricultural consultant (2 wheels) Additional Comments: resides at [facility]    Prior Function Prior Level of Function : Independent/Modified Independent;Driving                     Hand Dominance        Extremity/Trunk Assessment  Upper Extremity Assessment Upper Extremity Assessment: Overall WFL for tasks assessed    Lower Extremity Assessment Lower Extremity Assessment: Generalized weakness    Cervical / Trunk Assessment Cervical / Trunk Assessment: Normal  Communication   Communication: No difficulties  Cognition Arousal/Alertness: Awake/alert Behavior During Therapy: WFL for tasks assessed/performed Overall Cognitive Status: Within Functional Limits for tasks assessed                                          General Comments      Exercises      Assessment/Plan    PT Assessment Patient needs continued PT services  PT Problem List Decreased activity tolerance;Decreased balance;Decreased mobility       PT Treatment Interventions DME instruction;Therapeutic activities;Gait training;Functional mobility training;Patient/family education    PT Goals (Current goals can be found in the Care Plan section)  Acute Rehab PT Goals Patient Stated Goal: to get stronger PT Goal Formulation: With patient Time For Goal Achievement: 11/28/22 Potential to Achieve Goals: Good    Frequency Min 1X/week     Co-evaluation               AM-PAC PT "6 Clicks" Mobility  Outcome Measure Help needed turning from your back to your side while in a flat bed without using bedrails?: A Little Help needed moving from lying on your back to sitting on the side of a flat bed without using bedrails?: A Little Help needed moving to and from a bed to a chair (including a wheelchair)?: A Little Help needed standing up from a chair using your arms (e.g., wheelchair or bedside chair)?: A Little Help needed to walk in hospital room?: A Little Help needed climbing 3-5 steps with a railing? : A Lot 6 Click Score: 17    End of Session Equipment Utilized During Treatment: Gait belt Activity Tolerance: Patient tolerated treatment well Patient left: in chair;with call bell/phone within reach Nurse Communication: Mobility status PT Visit Diagnosis: Unsteadiness on feet (R26.81)    Time: 9562-1308 PT Time Calculation (min) (ACUTE ONLY): 22 min   Charges:   PT Evaluation $PT Eval Low Complexity: 1 Low   PT General Charges $$ ACUTE PT VISIT: 1 Visit         Blanchard Kelch PT Acute Rehabilitation Services Office [phone] Weekend pager-[phone]   Rada Hay 11/14/2022, 1:28 PM

## 2022-11-14 NOTE — Plan of Care (Signed)

## 2022-11-15 DIAGNOSIS — E871 Hypo-osmolality and hyponatremia: Secondary | ICD-10-CM | POA: Diagnosis not present

## 2022-11-15 LAB — BASIC METABOLIC PANEL WITH GFR
Anion gap: 8 (ref 5–15)
BUN: 12 mg/dL (ref 8–23)
CO2: 20 mmol/L — ABNORMAL LOW (ref 22–32)
Calcium: 8 mg/dL — ABNORMAL LOW (ref 8.9–10.3)
Chloride: 98 mmol/L (ref 98–111)
Creatinine, Ser: 0.58 mg/dL (ref 0.44–1.00)
GFR, Estimated: >60 mL/min (ref >60)
Glucose, Bld: 96 mg/dL (ref 70–99)
Potassium: 3.6 mmol/L (ref 3.5–5.1)
Sodium: 126 mmol/L — ABNORMAL LOW (ref 135–145)

## 2022-11-15 LAB — GLUCOSE, CAPILLARY: Glucose-Capillary: 121 mg/dL — ABNORMAL HIGH (ref 70–99)

## 2022-11-15 LAB — SODIUM
Sodium: 131 mmol/L — ABNORMAL LOW (ref 135–145)
Sodium: 130 mmol/L — ABNORMAL LOW (ref 135–145)

## 2022-11-15 MED ORDER — TOLVAPTAN 15 MG PO TABS
15.0000 mg | ORAL_TABLET | Freq: Once | ORAL | Status: AC
  Administered 2022-11-15: 15 mg via ORAL
  Filled 2022-11-15: qty 1

## 2022-11-15 MED ORDER — SODIUM CHLORIDE 1 G PO TABS: 1.0000 g | ORAL_TABLET | Freq: Three times a day (TID) | ORAL | Status: DC

## 2022-11-15 NOTE — Progress Notes (Signed)
[facility] Progress Note  Subjective: seen in room, Na remains at 126. Feels lightheaded.   Vitals:   11/14/22 1530 11/14/22 2136 11/15/22 0500 11/15/22 0537  BP: (!) 145/71 (!) 153/79  (!) 151/84  Pulse: 90 85  74  Resp: 14 18  18   Temp:  98.6 F (37 C)  98.3 F (36.8 C)  TempSrc:      SpO2: 97% 97%  97%  Weight:   52 kg   Height:        Exam: Gen alert, elderly, pleasant lady  No jvd or bruits Chest clear bilat to bases RRR no RG Abd soft ntnd no mass or ascites +bs Ext no LE edema Neuro is alert, Ox 3 , nf    Home meds include - vit D, zyrtec, tylenol, prolia, melatonin, mvi, preservision, myrbetriq, olmesartan, ondansetron, systane gtts, triamcinolone     Assessment/ Plan: Hyponatremia - Na+ 116 in elderly pleasant lady w/ not a lot of medical problems. Feeling bad for 3-4 days w/ nausea and malaise, no vomiting/ diarrhea. Meds reviewed, no thiazides/ SSRI's, is on an ARB. No hx or signs of CHF/ liver / renal failure. No hx thyroid disease. UNa 63 and UOsm 440. 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 Cause is not clear. CXR and CT head were negative. TSH/ cortisol were wnl. Other possible short-term causes could be (1) recent viral URI,  (2) recent nausea and /or (3) ARB effects. 3% saline started 7/25 and will be dc'd 7/27 w/ Na up to 126.  Na+ today no better despite salt tabs and 1000cc fluid restriction. Will give tolvaptan x1 this am (and will hold salt tabs/ fluid restriction while on tolvaptan).  HTN - on losartan, will hold for now. Substituted hydralazine 25 tid and BP's stable. Would keep off arb/ acei's for now.     Vinson Moselle MD  [facility] 11/15/2022, 9:14 AM  Recent Labs  Lab 11/12/22 1131 11/12/22 1526 11/13/22 0321 11/13/22 0322 11/13/22 0815 11/15/22 0346  HGB 13.5  --  13.3  --   --   --   ALBUMIN  --  3.6  --   --   --   --   CALCIUM 8.4* 7.7*  --  7.7*  --  8.0*  PHOS 2.4*  --   --   --  3.1  --   CREATININE 0.61 0.51  --  0.51  --  0.58  K 4.0 3.9   --  3.4*  --  3.6   No results for input(s): "IRON", "TIBC", "FERRITIN" in the last 168 hours. Inpatient medications:  Chlorhexidine Gluconate Cloth  6 each Topical Daily   enoxaparin (LOVENOX) injection  40 mg Subcutaneous Q24H   hydrALAZINE  25 mg Oral Q8H   melatonin  5 mg Oral QHS   sodium chloride  1 g Oral TID WC   tolvaptan  15 mg Oral Once     acetaminophen **OR** acetaminophen, hydrALAZINE, morphine injection, Muscle Rub, ondansetron **OR** ondansetron (ZOFRAN) IV, mouth rinse, polyvinyl alcohol, simethicone

## 2022-11-15 NOTE — TOC Initial Note (Signed)
Transition of Care [facility]) - Initial/Assessment Note    Patient Details  Name: Yvonne Fry MRN: [id number] Date of Birth: 29-Jan-1929  [facility]) CM/SW Contact:    Adrian Prows, RN Phone Number: 11/15/2022, 11:18 AM  Clinical Narrative:                 [facility] consult for d/c planning; PT recc SNF; spoke w/ pt in room; pt says [place] from [place] [facility] and she wants to use the facility's SNF; she identified POC son Aerianna Crotzer [phone]); pt denies difficulty paying utilities, food/housing insecurity, and IPV; she has transportation; she has glasses, walker, built in shower chair, and grab bar; pt says she is able to feed herself; she is continent of bowel and bladder; explained SNF process; pt verbalized understanding.  Expected Discharge Plan: Skilled Nursing Facility Barriers to Discharge: Continued Medical Work up   Patient Goals and CMS Choice Patient states their goals for this hospitalization and ongoing recovery are:: SNF at [facility]          Expected Discharge Plan and Services   Discharge Planning Services: CM Consult   Living arrangements for the past 2 months: Independent Living Facility                                      Prior Living Arrangements/Services Living arrangements for the past 2 months: Independent Living Facility Lives with:: Self Patient language and need for interpreter reviewed:: Yes Do you feel safe going back to the place where you live?: Yes      Need for Family Participation in Patient Care: Yes (Comment) Care giver support system in place?: Yes (comment) Current home services: DME (walker) Criminal Activity/Legal Involvement Pertinent to Current Situation/Hospitalization: No - Comment as needed  Activities of Daily Living Home Assistive Devices/Equipment: None ADL Screening (condition at time of admission) Patient's cognitive ability adequate to safely complete daily activities?: Yes Is the  patient deaf or have difficulty hearing?: No Does the patient have difficulty seeing, even when wearing glasses/contacts?: Yes Does the patient have difficulty concentrating, remembering, or making decisions?: No Patient able to express need for assistance with ADLs?: Yes Does the patient have difficulty dressing or bathing?: No Independently performs ADLs?: Yes (appropriate for developmental age) Does the patient have difficulty walking or climbing stairs?: No Weakness of Legs: None Weakness of Arms/Hands: None  Permission Sought/Granted Permission sought to share information with : Case Manager Permission granted to share information with : Yes, Verbal Permission Granted  Share Information with NAME: Case Manager     Permission granted to share info w Relationship: Vickilynn Grattan (son) [phone]     Emotional Assessment Appearance:: Appears stated age Attitude/Demeanor/Rapport: Gracious Affect (typically observed): Accepting Orientation: : Oriented to Self, Oriented to Place, Oriented to  Time, Oriented to Situation Alcohol / Substance Use: Not Applicable Psych Involvement: No (comment)  Admission diagnosis:  Acute hyponatremia [E87.1] Hyponatremia [E87.1] Patient Active Problem List   Diagnosis Date Noted   Hyponatremia 11/12/2022   Allergic rhinitis 11/12/2022   Hypophosphatemia 11/12/2022   Hypocalcemia 11/12/2022   Closed nondisplaced fracture of pelvis (HCC) 04/21/2020   Localized edema 04/21/2020   Essential hypertension 04/21/2020   Overactive bladder 04/21/2020   Senile osteoporosis 04/21/2020   PCP:  [name], MD Pharmacy:   [phone], [place] - [address] [place]  [place] [address] Phone: [phone] Fax: [phone]  [facility] [place] [facility] [address] [place] [address] Phone: [phone] Fax: [phone]     Social Determinants of Health (SDOH) Social  History: SDOH Screenings   Food Insecurity: No Food Insecurity (11/15/2022)  Housing: Low Risk  (11/15/2022)  Transportation Needs: No Transportation Needs (11/15/2022)  Utilities: Not At Risk (11/15/2022)  Tobacco Use: Low Risk  (11/12/2022)   SDOH Interventions: Food Insecurity Interventions: Intervention Not Indicated, Inpatient TOC Housing Interventions: Intervention Not Indicated, Inpatient TOC Transportation Interventions: Intervention Not Indicated, Inpatient TOC Utilities Interventions: Intervention Not Indicated, Inpatient TOC   Readmission Risk Interventions     No data to display

## 2022-11-15 NOTE — Plan of Care (Signed)

## 2022-11-15 NOTE — Plan of Care (Signed)

## 2022-11-15 NOTE — Progress Notes (Signed)
Mobility Specialist - Progress Note   11/15/22 1444  Mobility  Activity Ambulated with assistance in hallway  Level of Assistance Standby assist, set-up cues, supervision of patient - no hands on  Assistive Device Front wheel walker  Distance Ambulated (ft) 300 ft  Range of Motion/Exercises Active  Activity Response Tolerated well  Mobility Referral Yes  $Mobility charge 1 Mobility  Mobility Specialist Start Time (ACUTE ONLY) 1415  Mobility Specialist Stop Time (ACUTE ONLY) 1444  Mobility Specialist Time Calculation (min) (ACUTE ONLY) 29 min   Pt was found in bed and agreeable to ambulate. Grew fatigued with session and at EOS returned to bed with call bell in reach. Bed alarm on.  Billey Chang Mobility Specialist

## 2022-11-15 NOTE — Progress Notes (Signed)
Pharmacy Brief Note: Tolvaptan  Discussed with Dr. Arlean Hopping. Order placed to check Na levels at 1700 & 2300. Per MD - no need for pharmacy to follow levels. Pharmacy to sign off.   Cindi Carbon, PharmD 11/15/22 11:37 AM

## 2022-11-15 NOTE — Progress Notes (Signed)
[facility] PROGRESS NOTE    Progress Note  Yvonne Fry  JYN:[id number] DOB: 08-16-1928 DOA: 11/12/2022 PCP: [name], MD     Brief Narrative:   Yvonne Fry is an 89 y.o. female past medical history significant for diverticulosis, hemorrhoids, microscopic hematuria comes into the ED for abnormal labs and complaining of malaise nausea and no appetite for several days was found to have a sodium of 116 in the ED was given IV fluids and her sodium got worse.  Nephrology was consulted admitted to the ICU and started on hypertonic saline.   Assessment/Plan:   Hyponatremia: Question SIADH Nephrology was consulted and started on hypertonic saline and now on sodium tablets her sodium is 126.  Workup was unremarkable. Scan showed no acute findings. Respiratory panel was negative. PT OT eval, she will need skilled nursing facility. She TOC has been notified.  Essential hypertension Hold ACE ACE inhibitor and use hydralazine as needed.  Hypophosphatemia: Repeating orally, now improved.  Hypocalcemia: Corrected for albumin unremarkable.   DVT prophylaxis: lovenox Family Communication:none Status is: Inpatient Remains inpatient appropriate because: Symptomatic hyponatremia    Code Status:     Code Status Orders  (From admission, onward)           Start     Ordered   11/12/22 1427  Full code  Continuous       Question:  By:  Answer:  Consent: discussion documented in EHR   11/12/22 1427           Code Status History     This patient has a current code status but no historical code status.      Advance Directive Documentation    Flowsheet Row Most Recent Value  Type of Advance Directive Healthcare Power of Attorney  Pre-existing out of facility DNR order (yellow form or pink MOST form) --  "MOST" Form in Place? --         IV Access:   Peripheral IV   Procedures and diagnostic studies:   DG Abd 1 View  Result Date:  11/14/2022 CLINICAL DATA:  89 year old female with history of abdominal pain. EXAM: ABDOMEN - 1 VIEW COMPARISON:  No priors. FINDINGS: Some gas and stool are noted throughout the colon and rectum. No pathologic dilatation of small bowel. No gross pneumoperitoneum noted on this single supine image. Dextroscoliosis of the lumbar spine. IMPRESSION: 1. Nonobstructive bowel gas pattern. 2. No pneumoperitoneum. Electronically Signed   By: Trudie Reed M.D.   On: 11/14/2022 09:23   CT HEAD WO CONTRAST ( )  Result Date: 11/13/2022 CLINICAL DATA:  Headache, new onset (Age >= 51y) EXAM: CT HEAD WITHOUT CONTRAST TECHNIQUE: Contiguous axial images were obtained from the base of the skull through the vertex without intravenous contrast. RADIATION DOSE REDUCTION: This exam was performed according to the departmental dose-optimization program which includes automated exposure control, adjustment of the mA and/or kV according to patient size and/or use of iterative reconstruction technique. COMPARISON:  None Available. FINDINGS: Brain: There is extensive periventricular hypoattenuation, which is nonspecific, and could represent sequela of severe chronic microvascular ischemic change. The left lentiform nucleus is difficult to visualize (series 3, image 16). There is an age indeterminate infarct in the right thalamus (series 5, image 25). No hydrocephalus. No extra-axial fluid collection. Vascular: No disproportionately hyperdense vessel or unexpected calcification. Skull: Normal. Negative for fracture or focal lesion. Sinuses/Orbits: No middle ear or mastoid effusion. Paranasal sinuses are clear. Orbits are notable for bilateral lens replacement, otherwise  unremarkable. Other: None. IMPRESSION: 1. The left lentiform nucleus is difficult to visualize. 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 Recommend further evaluation  with a brain MRI. 2. 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 Attention on MRI. 3. Extensive periventricular hypoattenuation, which is nonspecific, and could represent sequela of severe chronic microvascular ischemic change. These results will be called to the ordering clinician or representative by the Radiologist Assistant, and communication documented in the PACS or [employer]. Electronically Signed   By: Lorenza Cambridge M.D.   On: 11/13/2022 15:21     Medical Consultants:   None.   Subjective:    Yvonne Fry no complaints today.  Objective:    Vitals:   11/14/22 1530 11/14/22 2136 11/15/22 0500 11/15/22 0537  BP: (!) 145/71 (!) 153/79  (!) 151/84  Pulse: 90 85  74  Resp: 14 18  18   Temp:  98.6 F (37 C)  98.3 F (36.8 C)  TempSrc:      SpO2: 97% 97%  97%  Weight:   52 kg   Height:       SpO2: 97 %   Intake/Output Summary (Last 24 hours) at 11/15/2022 0835 Last data filed at 11/14/2022 1200 Gross per 24 hour  Intake 185 ml  Output --  Net 185 ml   Filed Weights   11/13/22 0500 11/14/22 0408 11/15/22 0500  Weight: 52.2 kg 53 kg 52 kg    Exam: General exam: In no acute distress. Respiratory system: Good air movement and clear to auscultation. Cardiovascular system: S1 & S2 heard, RRR. No JVD. Extremities: No pedal edema. Skin: No rashes, lesions or ulcers Psychiatry: Judgement and insight appear normal. Mood & affect appropriate.  Data Reviewed:    Labs: Basic Metabolic Panel: Recent Labs  Lab 11/12/22 1131 11/12/22 1526 11/12/22 1855 11/13/22 0322 11/13/22 0815 11/13/22 1049 11/14/22 1059 11/14/22 1300 11/14/22 1803 11/14/22 2155 11/15/22 0346  NA 116* 115*   < > 122* 123*   < > 124* 126* 126* 126* 126*  K 4.0 3.9  --  3.4*  --   --   --   --   --   --  3.6  CL 84* 85*  --  91*  --   --   --   --   --   --  98  CO2 24 18*  --  20*  --   --   --   --   --   --  20*  GLUCOSE 100* 95  --  87  --   --   --   --   --   --  96  BUN 9  7*  --  7*  --   --   --   --   --   --  12  CREATININE 0.61 0.51  --  0.51  --   --   --   --   --   --  0.58  CALCIUM 8.4* 7.7*  --  7.7*  --   --   --   --   --   --  8.0*  MG 1.8  --   --   --   --   --   --   --   --   --   --   PHOS 2.4*  --   --   --  3.1  --   --   --   --   --   --    < > =  values in this interval not displayed.   GFR Estimated Creatinine Clearance: 37.6 mL/min (by C-G formula based on SCr of 0.58 mg/dL). Liver Function Tests: Recent Labs  Lab 11/12/22 1526  AST 26  ALT 16  ALKPHOS 45  BILITOT 0.8  PROT 6.7  ALBUMIN 3.6   No results for input(s): "LIPASE", "AMYLASE" in the last 168 hours. No results for input(s): "AMMONIA" in the last 168 hours. Coagulation profile No results for input(s): "INR", "PROTIME" in the last 168 hours. COVID-19 Labs  No results for input(s): "DDIMER", "FERRITIN", "LDH", "CRP" in the last 72 hours.  Lab Results  Component Value Date   SARSCOV2NAA Not Detected 04/18/2019   SARSCOV2NAA Not Detected 03/22/2019   SARSCOV2NAA Not Detected 12/21/2018    CBC: Recent Labs  Lab 11/12/22 1131 11/13/22 0321  WBC 7.4 7.8  NEUTROABS 5.2  --   HGB 13.5 13.3  HCT 39.1 39.0  MCV 87.7 88.8  PLT 373 396   Cardiac Enzymes: No results for input(s): "CKTOTAL", "CKMB", "CKMBINDEX", "TROPONINI" in the last 168 hours. BNP (last 3 results) No results for input(s): "PROBNP" in the last 8760 hours. CBG: No results for input(s): "GLUCAP" in the last 168 hours. D-Dimer: No results for input(s): "DDIMER" in the last 72 hours. Hgb A1c: No results for input(s): "HGBA1C" in the last 72 hours. Lipid Profile: No results for input(s): "CHOL", "HDL", "LDLCALC", "TRIG", "CHOLHDL", "LDLDIRECT" in the last 72 hours. Thyroid function studies: Recent Labs    11/13/22 0321  TSH 3.131   Anemia work up: No results for input(s): "VITAMINB12", "FOLATE", "FERRITIN", "TIBC", "IRON", "RETICCTPCT" in the last 72 hours. Sepsis Labs: Recent Labs   Lab 11/12/22 1131 11/13/22 0321  WBC 7.4 7.8   Microbiology Recent Results (from the past 240 hour(s))  MRSA Next Gen by PCR, Nasal     Status: None   Collection Time: 11/12/22  3:05 PM   Specimen: Nasal Mucosa; Nasal Swab  Result Value Ref Range Status   MRSA by PCR Next Gen NOT DETECTED NOT DETECTED Final    Comment: (NOTE) The GeneXpert MRSA Assay (FDA approved for NASAL specimens only), is one component of a comprehensive MRSA colonization surveillance program. It is not intended to diagnose MRSA infection nor to guide or monitor treatment for MRSA infections. Test performance is not FDA approved in patients less than 92 years old. Performed at [facility], [address] [place], [place] [address]   C Difficile Quick Screen w PCR reflex     Status: None   Collection Time: 11/13/22  2:14 PM   Specimen: STOOL  Result Value Ref Range Status   C Diff antigen NEGATIVE NEGATIVE Final   C Diff toxin NEGATIVE NEGATIVE Final   C Diff interpretation No C. difficile detected.  Final    Comment: Performed at [facility], [address], [place], [place] [address]  Respiratory (~20 pathogens) panel by PCR     Status: None   Collection Time: 11/13/22  2:14 PM   Specimen: Nasopharyngeal Swab; Respiratory  Result Value Ref Range Status   Adenovirus NOT DETECTED NOT DETECTED Final   Coronavirus 229E NOT DETECTED NOT DETECTED Final    Comment: (NOTE) The Coronavirus on the Respiratory Panel, DOES NOT test for the novel  Coronavirus (2019 nCoV)    Coronavirus HKU1 NOT DETECTED NOT DETECTED Final   Coronavirus NL63 NOT DETECTED NOT DETECTED Final   Coronavirus OC43 NOT DETECTED NOT DETECTED Final   Metapneumovirus NOT DETECTED  NOT DETECTED Final   Rhinovirus / Enterovirus NOT DETECTED NOT DETECTED Final   Influenza A NOT DETECTED NOT DETECTED Final   Influenza B NOT DETECTED NOT DETECTED Final   Parainfluenza Virus 1 NOT DETECTED NOT DETECTED  Final   Parainfluenza Virus 2 NOT DETECTED NOT DETECTED Final   Parainfluenza Virus 3 NOT DETECTED NOT DETECTED Final   Parainfluenza Virus 4 NOT DETECTED NOT DETECTED Final   Respiratory Syncytial Virus NOT DETECTED NOT DETECTED Final   Bordetella pertussis NOT DETECTED NOT DETECTED Final   Bordetella Parapertussis NOT DETECTED NOT DETECTED Final   Chlamydophila pneumoniae NOT DETECTED NOT DETECTED Final   Mycoplasma pneumoniae NOT DETECTED NOT DETECTED Final    Comment: Performed at [facility], [address] [place], [place] [address]     Medications:    Chlorhexidine Gluconate Cloth  6 each Topical Daily   enoxaparin (LOVENOX) injection  40 mg Subcutaneous Q24H   hydrALAZINE  25 mg Oral Q8H   melatonin  5 mg Oral QHS   sodium chloride  1 g Oral TID WC   tolvaptan  15 mg Oral Once   Continuous Infusions:      LOS: 3 days   Marinda Elk  [facility]  11/15/2022, 8:35 AM

## 2022-11-15 NOTE — NC FL2 (Signed)
[place] MEDICAID FL2 LEVEL OF CARE FORM     IDENTIFICATION  Patient Name: Yvonne Fry Birthdate: [birth year] Sex: female Admission Date (Current Location): 11/12/2022  [facility] and [place] Number:  Producer, television/film/video and Address:  [facility],  [address], [place] [address]      Provider Number: [id number]  Attending Physician Name and Address:  Marinda Elk, MD  Relative Name and Phone Number:  Jahari Shoenfelt (son) [phone]    Current Level of Care: Hospital Recommended Level of Care: Skilled Nursing Facility Prior Approval Number:    Date Approved/Denied:   PASRR Number: [id number]  Discharge Plan: SNF    Current Diagnoses: Patient Active Problem List   Diagnosis Date Noted   Hyponatremia 11/12/2022   Allergic rhinitis 11/12/2022   Hypophosphatemia 11/12/2022   Hypocalcemia 11/12/2022   Closed nondisplaced fracture of pelvis (HCC) 04/21/2020   Localized edema 04/21/2020   Essential hypertension 04/21/2020   Overactive bladder 04/21/2020   Senile osteoporosis 04/21/2020    Orientation RESPIRATION BLADDER Height & Weight     Self, Time, Situation, Place  Normal Continent Weight: 52 kg Height:  5\' 4"  (162.6 cm)  BEHAVIORAL SYMPTOMS/MOOD NEUROLOGICAL BOWEL NUTRITION STATUS      Continent Diet (Regular)  AMBULATORY STATUS COMMUNICATION OF NEEDS Skin   Limited Assist Verbally Bruising (Echymosis on right knee)                       Personal Care Assistance Level of Assistance  Bathing, Feeding, Dressing Bathing Assistance: Limited assistance Feeding assistance: Independent Dressing Assistance: Limited assistance     Functional Limitations Info  Sight, Hearing, Speech Sight Info: Impaired (glasses) Hearing Info: Adequate Speech Info: Adequate    SPECIAL CARE FACTORS FREQUENCY  PT (By licensed PT), OT (By licensed OT)     PT Frequency: 5x/week OT Frequency: 5x/week            Contractures Contractures Info:  Not present    Additional Factors Info  Code Status, Allergies Code Status Info: Full Allergies Info: Neosporin (Neomycin-bacitracin Zn-polymyx)           Current Medications (11/15/2022):  This is the current hospital active medication list Current Facility-Administered Medications  Medication Dose Route Frequency Provider Last Rate Last Admin   acetaminophen (TYLENOL) tablet 650 mg  650 mg Oral Q6H PRN Marinda Elk, MD   650 mg at 11/14/22 1650   Or   acetaminophen (TYLENOL) suppository 650 mg  650 mg Rectal Q6H PRN Marinda Elk, MD       Chlorhexidine Gluconate Cloth 2 % PADS 6 each  6 each Topical Daily Marinda Elk, MD   6 each at 11/14/22 0950   enoxaparin (LOVENOX) injection 40 mg  40 mg Subcutaneous Q24H Marinda Elk, MD   40 mg at 11/14/22 2136   hydrALAZINE (APRESOLINE) injection 10 mg  10 mg Intravenous Q4H PRN Marinda Elk, MD   10 mg at 11/13/22 2302   hydrALAZINE (APRESOLINE) tablet 25 mg  25 mg Oral Q8H Marinda Elk, MD   25 mg at 11/15/22 0545   melatonin tablet 5 mg  5 mg Oral QHS Marinda Elk, MD   5 mg at 11/14/22 2136   morphine (PF) 2 MG/ML injection 1 mg  1 mg Intravenous Q4H PRN Marinda Elk, MD       Muscle Rub CREA   Topical PRN Marinda Elk, MD  ondansetron (ZOFRAN) tablet 4 mg  4 mg Oral Q6H PRN Marinda Elk, MD       Or   ondansetron [facility]) injection 4 mg  4 mg Intravenous Q6H PRN Marinda Elk, MD       Oral care mouth rinse  15 mL Mouth Rinse PRN Marinda Elk, MD       polyvinyl alcohol (LIQUIFILM TEARS) 1.4 % ophthalmic solution 1 drop  1 drop Both Eyes PRN Marinda Elk, MD       simethicone [facility]) chewable tablet 80 mg  80 mg Oral Q6H PRN Marinda Elk, MD   80 mg at 11/13/22 2234     Discharge Medications: Please see discharge summary for a list of discharge medications.  Relevant Imaging Results:  Relevant Lab  Results:   Additional Information SSN [id number]  Adrian Prows, RN

## 2022-11-16 DIAGNOSIS — E871 Hypo-osmolality and hyponatremia: Secondary | ICD-10-CM | POA: Diagnosis not present

## 2022-11-16 LAB — SODIUM: Sodium: 129 mmol/L — ABNORMAL LOW (ref 135–145)

## 2022-11-16 MED ORDER — SODIUM CHLORIDE 1 G PO TABS
1.0000 g | ORAL_TABLET | Freq: Three times a day (TID) | ORAL | Status: DC
  Administered 2022-11-16 (×2): 1 g via ORAL
  Filled 2022-11-16 (×2): qty 1

## 2022-11-16 MED ORDER — MELATONIN 3 MG PO TABS
3.0000 mg | ORAL_TABLET | Freq: Once | ORAL | Status: AC
  Administered 2022-11-16: 3 mg via ORAL
  Filled 2022-11-16: qty 1

## 2022-11-16 MED ORDER — POLYETHYLENE GLYCOL 3350 17 G PO PACK
17.0000 g | PACK | Freq: Two times a day (BID) | ORAL | Status: DC
  Administered 2022-11-16: 17 g via ORAL
  Filled 2022-11-16: qty 1

## 2022-11-16 NOTE — Progress Notes (Signed)
[facility] Progress Note  Subjective: seen in room, Na 130 at 11:30pm yesterday and hasn't been checked since - I ordered this AM but order was cancelled.  She is feeling improved day to day but is still having to force herself to eat much.  She is able to keep it down but just doesn't feel like eating much.     Vitals:   11/15/22 1214 11/15/22 1950 11/16/22 0500 11/16/22 0543  BP: (!) 149/95 (!) 159/84  (!) 148/83  Pulse: 77 83  79  Resp: 14 16  18   Temp: 98.1 F (36.7 C) 98.7 F (37.1 C)  98.2 F (36.8 C)  TempSrc: Oral Oral  Oral  SpO2: 98% 98%  95%  Weight:   53.3 kg   Height:        Exam: Gen alert, elderly, pleasant lady  No jvd or bruits Chest clear bilat to bases RRR no RG Abd soft ntnd no mass or ascites +bs Ext no LE edema Neuro is alert, Ox 3 , nf    Home meds include - vit D, zyrtec, tylenol, prolia, melatonin, mvi, preservision, myrbetriq, olmesartan, ondansetron, systane gtts, triamcinolone     Assessment/ Plan: Hyponatremia - presented with Na+ 116 in elderly pleasant lady w/ not a lot of medical problems. Feeling bad for 3-4 days w/ nausea and malaise, no vomiting/ diarrhea. Meds reviewed, no thiazides/ SSRI's, is on an ARB. No hx or signs of CHF/ liver / renal failure. No hx thyroid disease. UNa 63 and UOsm 440. 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 Cause is not clear. CXR and CT head were negative. TSH/ cortisol were wnl. Other possible short-term causes could be (1) recent viral illness,  (2) recent nausea and /or (3) ARB effects. 3% saline started 7/25 and dc'd 7/27 w/ Na up to 126.  Na+ stagnant yesterday so she rec'd 1 dose of tolvaptan.  Trended up to 130 yesterday PM.   For d/c to SNF today but would check sodium to ensure stable today --> ordered again ~1:30pm.  If stable ok for her to d/c and will need sodium rechecked mid week -- please have it checked through PCPs office and if needed I can be reached to help if trending down again.  For now would send  out on nacl 1 TID and fluid restriction but hopefully this was all transient and will work itself out so she doesn't have to stay on these long term. HTN - on losartan, will hold for now. Substituted hydralazine 25 tid and BP's stable. Would keep off arb/ acei's for now.   Hopefully won't need longterm nephrology care -- as above PCP can recheck sodium this week and if still and issue I'd be happy to be involved outpt.  I can just be paged with concerns.   Estill Bakes MD [facility] Pager [phone]   Recent Labs  Lab 11/12/22 1131 11/12/22 1526 11/13/22 0321 11/13/22 0322 11/13/22 0815 11/15/22 0346  HGB 13.5  --  13.3  --   --   --   ALBUMIN  --  3.6  --   --   --   --   CALCIUM 8.4* 7.7*  --  7.7*  --  8.0*  PHOS 2.4*  --   --   --  3.1  --   CREATININE 0.61 0.51  --  0.51  --  0.58  K 4.0 3.9  --  3.4*  --  3.6   No results for input(s): "IRON", "  TIBC", "FERRITIN" in the last 168 hours. Inpatient medications:  Chlorhexidine Gluconate Cloth  6 each Topical Daily   enoxaparin (LOVENOX) injection  40 mg Subcutaneous Q24H   hydrALAZINE  25 mg Oral Q8H   melatonin  5 mg Oral QHS   polyethylene glycol  17 g Oral BID   sodium chloride  1 g Oral TID WC     acetaminophen **OR** acetaminophen, hydrALAZINE, morphine injection, Muscle Rub, ondansetron **OR** ondansetron (ZOFRAN) IV, mouth rinse, polyvinyl alcohol, simethicone

## 2022-11-16 NOTE — Progress Notes (Signed)
Report called to [facility]. Iv taken out, pt getting dressed. Waiting on transport from facility for patient.

## 2022-11-16 NOTE — Progress Notes (Signed)
Physical Therapy Treatment Patient Details Name: Yvonne Fry MRN: [id number] DOB: 1929/01/23 Today's Date: 11/16/2022   History of Present Illness Yvonne Fry is an 89 y.o. female past medical history significant for diverticulosis, hemorrhoids, microscopic hematuria comes into the ED for abnormal labs and complaining of malaise nausea and no appetite for several days was found to have a sodium of 116    PT Comments  Progressing with mobility. Pt participated well. Plan is for d/c to rehab section of [facility] before returning to her [facility] apt.     If plan is discharge home, recommend the following: A little help with walking and/or transfers;A little help with bathing/dressing/bathroom;Assistance with cooking/housework;Assist for transportation;Help with stairs or ramp for entrance   Can travel by private vehicle     Yes  Equipment Recommendations  None recommended by PT    Recommendations for Other Services       Precautions / Restrictions Precautions Precautions: Fall Restrictions Weight Bearing Restrictions: No     Mobility  Bed Mobility               General bed mobility comments: in bathroom upon PT arrival    Transfers Overall transfer level: Needs assistance Equipment used: Rolling walker (2 wheels) Transfers: Sit to/from Stand Sit to Stand: Supervision           General transfer comment: increaesed time.    Ambulation/Gait Ambulation/Gait assistance: Min guard Gait Distance (Feet): 250 Feet Assistive device: Rolling walker (2 wheels) Gait Pattern/deviations: Step-through pattern, Decreased stride length       General Gait Details: dyspnea 2/4. fatigue set in after ~150 feet. Min guard for safety. Mildly unsteady once fatigued but no LOB   Stairs             Wheelchair Mobility     Tilt Bed    Modified Rankin (Stroke Patients Only)       Balance Overall balance assessment: Mild deficits observed, not formally  tested                                          Cognition Arousal/Alertness: Awake/alert Behavior During Therapy: WFL for tasks assessed/performed Overall Cognitive Status: Within Functional Limits for tasks assessed                                 General Comments: very pleasant        Exercises      General Comments        Pertinent Vitals/Pain Pain Assessment Pain Assessment: Faces Faces Pain Scale: Hurts a little bit Pain Location: back Pain Descriptors / Indicators: Discomfort Pain Intervention(s): Monitored during session    Home Living                          Prior Function            PT Goals (current goals can now be found in the care plan section) Progress towards PT goals: Progressing toward goals    Frequency    Min 1X/week      PT Plan Current plan remains appropriate    Co-evaluation              AM-PAC PT "6 Clicks" Mobility   Outcome Measure  Help needed turning  from your back to your side while in a flat bed without using bedrails?: None Help needed moving from lying on your back to sitting on the side of a flat bed without using bedrails?: None Help needed moving to and from a bed to a chair (including a wheelchair)?: None Help needed standing up from a chair using your arms (e.g., wheelchair or bedside chair)?: None Help needed to walk in hospital room?: A Little Help needed climbing 3-5 steps with a railing? : A Little 6 Click Score: 22    End of Session Equipment Utilized During Treatment: Gait belt Activity Tolerance: Patient tolerated treatment well Patient left: in chair;with call bell/phone within reach   PT Visit Diagnosis: Muscle weakness (generalized) (M62.81);Unsteadiness on feet (R26.81)     Time: 1030-1056 PT Time Calculation (min) (ACUTE ONLY): 26 min  Charges:    $Gait Training: 23-37 mins PT General Charges $$ ACUTE PT VISIT: 1 Visit                          Faye Ramsay, PT Acute Rehabilitation  Office: [phone]

## 2022-11-16 NOTE — Discharge Summary (Addendum)
Physician Discharge Summary  Yvonne Fry WCB:[id number] DOB: 1928/12/06 DOA: 11/12/2022  PCP: [name], MD  Admit date: 11/12/2022 Discharge date: 11/16/2022  Admitted From: Home Disposition:  SNF  Recommendations for Outpatient Follow-up:  Follow up with PCP in 1-2 weeks Please obtain BMP this week and send to PCP Dr. [name]   Home Health:No Equipment/Devices:None  Discharge Condition:Stable CODE STATUS:Full  Diet recommendation: Heart Healthy  Brief/Interim Summary: 89 y.o. female past medical history significant for diverticulosis, hemorrhoids, microscopic hematuria comes into the ED for abnormal labs and complaining of malaise nausea and no appetite for several days was found to have a sodium of 116 in the ED was given IV fluids and her sodium got worse.  Nephrology was consulted admitted to the ICU and started on hypertonic saline.   Discharge Diagnoses:  Principal Problem:   Hyponatremia Active Problems:   Essential hypertension   Hypophosphatemia   Hypocalcemia  Hyponatremia: On admission 116, question SIADH nephrology was consulted started on hypertonic saline and sodium tablets. CT scan of the head was unremarkable, respiratory panel was negative. His sodium got stuck at 126, she was started on tolvaptan and the day of discharge was 130. Physical therapy evaluated the patient she will need skilled nursing facility she will follow-up with nephrology as an outpatient will be made this week.  Essential hypertension: No changes made to her medication continue current regimen.  Hypophosphatemia: Repleted now improved.  Hypocalcemia: Corrected unremarkable.  Discharge Instructions   Allergies as of 11/16/2022       Reactions   Neosporin [neomycin-bacitracin Zn-polymyx]         Medication List     STOP taking these medications    Fluad Quadrivalent 0.5 ML injection Generic drug: influenza vaccine adjuvanted   Moderna COVID-19 Vaccine 100  MCG/0.5ML injection Generic drug: COVID-19 mRNA vaccine (Moderna)   PreserVision AREDS 2 Caps       TAKE these medications    acetaminophen 500 MG tablet Commonly known as: TYLENOL Take 1,000 mg by mouth every 8 (eight) hours as needed.   cetirizine 10 MG tablet Commonly known as: ZYRTEC Take 10 mg by mouth daily.   cholecalciferol 25 MCG (1000 UNIT) tablet Commonly known as: VITAMIN D3 Take 1,000 Units by mouth daily.   denosumab 60 MG/ML Sosy injection Commonly known as: PROLIA Inject 60 mg into the skin every 6 (six) months.   melatonin 3 MG Tabs tablet Take 3 mg by mouth at bedtime.   MULTIVITAMIN PO Take by mouth daily.   Myrbetriq 25 MG Tb24 tablet Generic drug: mirabegron ER Take 1 tablet by mouth daily.   olmesartan 40 MG tablet Commonly known as: BENICAR Take 40 mg by mouth daily.   ondansetron 4 MG tablet Commonly known as: ZOFRAN Take 4 mg by mouth.   Systane 0.4-0.3 % Gel ophthalmic gel Generic drug: Polyethyl Glycol-Propyl Glycol Place 1 application into both eyes.   Systane Ultra 0.4-0.3 % Soln Generic drug: Polyethyl Glycol-Propyl Glycol Apply to eye.   triamcinolone cream 0.1 % Commonly known as: KENALOG Apply 1 application topically 2 (two) times daily.        Allergies  Allergen Reactions   Neosporin [Neomycin-Bacitracin Zn-Polymyx]     Consultations: Nephrology   Procedures/Studies: DG Abd 1 View  Result Date: 11/14/2022 CLINICAL DATA:  89 year old female with history of abdominal pain. EXAM: ABDOMEN - 1 VIEW COMPARISON:  No priors. FINDINGS: Some gas and stool are noted throughout the colon and rectum. No pathologic dilatation of small  bowel. No gross pneumoperitoneum noted on this single supine image. Dextroscoliosis of the lumbar spine. IMPRESSION: 1. Nonobstructive bowel gas pattern. 2. No pneumoperitoneum. Electronically Signed   By: Trudie Reed M.D.   On: 11/14/2022 09:23   CT HEAD WO CONTRAST ( )  Result  Date: 11/13/2022 CLINICAL DATA:  Headache, new onset (Age >= 51y) EXAM: CT HEAD WITHOUT CONTRAST TECHNIQUE: Contiguous axial images were obtained from the base of the skull through the vertex without intravenous contrast. RADIATION DOSE REDUCTION: This exam was performed according to the departmental dose-optimization program which includes automated exposure control, adjustment of the mA and/or kV according to patient size and/or use of iterative reconstruction technique. COMPARISON:  None Available. FINDINGS: Brain: There is extensive periventricular hypoattenuation, which is nonspecific, and could represent sequela of severe chronic microvascular ischemic change. The left lentiform nucleus is difficult to visualize (series 3, image 16). There is an age indeterminate infarct in the right thalamus (series 5, image 25). No hydrocephalus. No extra-axial fluid collection. Vascular: No disproportionately hyperdense vessel or unexpected calcification. Skull: Normal. Negative for fracture or focal lesion. Sinuses/Orbits: No middle ear or mastoid effusion. Paranasal sinuses are clear. Orbits are notable for bilateral lens replacement, otherwise unremarkable. Other: None. IMPRESSION: 1. The left lentiform nucleus is difficult to visualize. 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 Recommend further evaluation with a brain MRI. 2. 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 Attention on MRI. 3. Extensive periventricular hypoattenuation, which is nonspecific, and could represent sequela of severe chronic microvascular ischemic change. These results will be called to the ordering clinician or representative by the Radiologist Assistant, and communication documented in the PACS or [employer]. Electronically Signed   By: Lorenza Cambridge M.D.   On: 11/13/2022 15:21   DG Chest 2 View  Result Date: 11/12/2022 CLINICAL  DATA:  SIADH EXAM: CHEST - 2 VIEW COMPARISON:  None Available. FINDINGS: Cardiac shadow is within normal limits. Aortic calcifications are noted. The lungs are clear bilaterally. No focal infiltrate or effusion is seen. Degenerative changes of the thoracic spine are seen with a lower thoracic compression deformity of uncertain chronicity. IMPRESSION: No acute abnormality noted. Electronically Signed   By: Alcide Clever M.D.   On: 11/12/2022 20:26   (Echo, Carotid, EGD, Colonoscopy, ERCP)    Subjective: No complaints  Discharge Exam: Vitals:   11/15/22 1950 11/16/22 0543  BP: (!) 159/84 (!) 148/83  Pulse: 83 79  Resp: 16 18  Temp: 98.7 F (37.1 C) 98.2 F (36.8 C)  SpO2: 98% 95%   Vitals:   11/15/22 1214 11/15/22 1950 11/16/22 0500 11/16/22 0543  BP: (!) 149/95 (!) 159/84  (!) 148/83  Pulse: 77 83  79  Resp: 14 16  18   Temp: 98.1 F (36.7 C) 98.7 F (37.1 C)  98.2 F (36.8 C)  TempSrc: Oral Oral  Oral  SpO2: 98% 98%  95%  Weight:   53.3 kg   Height:        General: Pt is alert, awake, not in acute distress Cardiovascular: RRR, S1/S2 +, no rubs, no gallops Respiratory: CTA bilaterally, no wheezing, no rhonchi Abdominal: Soft, NT, ND, bowel sounds + Extremities: no edema, no cyanosis    The results of significant diagnostics from this hospitalization (including imaging, microbiology, ancillary and laboratory) are listed below for reference.     Microbiology: Recent Results (from the past 240 hour(s))  MRSA Next Gen by PCR, Nasal  Status: None   Collection Time: 11/12/22  3:05 PM   Specimen: Nasal Mucosa; Nasal Swab  Result Value Ref Range Status   MRSA by PCR Next Gen NOT DETECTED NOT DETECTED Final    Comment: (NOTE) The GeneXpert MRSA Assay (FDA approved for NASAL specimens only), is one component of a comprehensive MRSA colonization surveillance program. It is not intended to diagnose MRSA infection nor to guide or monitor treatment for MRSA infections. Test  performance is not FDA approved in patients less than 54 years old. Performed at [facility], [address], [place], [place] [address]   Gastrointestinal Panel by PCR , Stool     Status: None   Collection Time: 11/13/22  2:14 PM   Specimen: Stool  Result Value Ref Range Status   Campylobacter species NOT DETECTED NOT DETECTED Final   Plesimonas shigelloides NOT DETECTED NOT DETECTED Final   Salmonella species NOT DETECTED NOT DETECTED Final   Yersinia enterocolitica NOT DETECTED NOT DETECTED Final   Vibrio species NOT DETECTED NOT DETECTED Final   Vibrio cholerae NOT DETECTED NOT DETECTED Final   Enteroaggregative E coli (EAEC) NOT DETECTED NOT DETECTED Final   Enteropathogenic E coli (EPEC) NOT DETECTED NOT DETECTED Final   Enterotoxigenic E coli (ETEC) NOT DETECTED NOT DETECTED Final   Shiga like toxin producing E coli (STEC) NOT DETECTED NOT DETECTED Final   Shigella/Enteroinvasive E coli (EIEC) NOT DETECTED NOT DETECTED Final   Cryptosporidium NOT DETECTED NOT DETECTED Final   Cyclospora cayetanensis NOT DETECTED NOT DETECTED Final   Entamoeba histolytica NOT DETECTED NOT DETECTED Final   Giardia lamblia NOT DETECTED NOT DETECTED Final   Adenovirus F40/41 NOT DETECTED NOT DETECTED Final   Astrovirus NOT DETECTED NOT DETECTED Final   Norovirus GI/GII NOT DETECTED NOT DETECTED Final   Rotavirus A NOT DETECTED NOT DETECTED Final   Sapovirus (I, II, IV, and V) NOT DETECTED NOT DETECTED Final    Comment: Performed at [facility], [address] [place], [place] [address]  C Difficile Quick Screen w PCR reflex     Status: None   Collection Time: 11/13/22  2:14 PM   Specimen: STOOL  Result Value Ref Range Status   C Diff antigen NEGATIVE NEGATIVE Final   C Diff toxin NEGATIVE NEGATIVE Final   C Diff interpretation No C. difficile detected.  Final    Comment: Performed at [facility], [address] [place], [place] [address]   Respiratory (~20 pathogens) panel by PCR     Status: None   Collection Time: 11/13/22  2:14 PM   Specimen: Nasopharyngeal Swab; Respiratory  Result Value Ref Range Status   Adenovirus NOT DETECTED NOT DETECTED Final   Coronavirus 229E NOT DETECTED NOT DETECTED Final    Comment: (NOTE) The Coronavirus on the Respiratory Panel, DOES NOT test for the novel  Coronavirus (2019 nCoV)    Coronavirus HKU1 NOT DETECTED NOT DETECTED Final   Coronavirus NL63 NOT DETECTED NOT DETECTED Final   Coronavirus OC43 NOT DETECTED NOT DETECTED Final   Metapneumovirus NOT DETECTED NOT DETECTED Final   Rhinovirus / Enterovirus NOT DETECTED NOT DETECTED Final   Influenza A NOT DETECTED NOT DETECTED Final   Influenza B NOT DETECTED NOT DETECTED Final   Parainfluenza Virus 1 NOT DETECTED NOT DETECTED Final   Parainfluenza Virus 2 NOT DETECTED NOT DETECTED Final   Parainfluenza Virus 3 NOT DETECTED NOT DETECTED Final   Parainfluenza Virus 4 NOT DETECTED NOT DETECTED Final  Respiratory Syncytial Virus NOT DETECTED NOT DETECTED Final   Bordetella pertussis NOT DETECTED NOT DETECTED Final   Bordetella Parapertussis NOT DETECTED NOT DETECTED Final   Chlamydophila pneumoniae NOT DETECTED NOT DETECTED Final   Mycoplasma pneumoniae NOT DETECTED NOT DETECTED Final    Comment: Performed at [facility], [address], [place], [place] [address]     Labs: BNP (last 3 results) No results for input(s): "BNP" in the last 8760 hours. Basic Metabolic Panel: Recent Labs  Lab 11/12/22 1131 11/12/22 1526 11/12/22 1855 11/13/22 0322 11/13/22 0815 11/13/22 1049 11/14/22 1803 11/14/22 2155 11/15/22 0346 11/15/22 1632 11/15/22 2308  NA 116* 115*   < > 122* 123*   < > 126* 126* 126* 131* 130*  K 4.0 3.9  --  3.4*  --   --   --   --  3.6  --   --   CL 84* 85*  --  91*  --   --   --   --  98  --   --   CO2 24 18*  --  20*  --   --   --   --  20*  --   --   GLUCOSE 100* 95  --  87  --   --   --   --  96  --    --   BUN 9 7*  --  7*  --   --   --   --  12  --   --   CREATININE 0.61 0.51  --  0.51  --   --   --   --  0.58  --   --   CALCIUM 8.4* 7.7*  --  7.7*  --   --   --   --  8.0*  --   --   MG 1.8  --   --   --   --   --   --   --   --   --   --   PHOS 2.4*  --   --   --  3.1  --   --   --   --   --   --    < > = values in this interval not displayed.   Liver Function Tests: Recent Labs  Lab 11/12/22 1526  AST 26  ALT 16  ALKPHOS 45  BILITOT 0.8  PROT 6.7  ALBUMIN 3.6   No results for input(s): "LIPASE", "AMYLASE" in the last 168 hours. No results for input(s): "AMMONIA" in the last 168 hours. CBC: Recent Labs  Lab 11/12/22 1131 11/13/22 0321  WBC 7.4 7.8  NEUTROABS 5.2  --   HGB 13.5 13.3  HCT 39.1 39.0  MCV 87.7 88.8  PLT 373 396   Cardiac Enzymes: No results for input(s): "CKTOTAL", "CKMB", "CKMBINDEX", "TROPONINI" in the last 168 hours. BNP: Invalid input(s): "POCBNP" CBG: Recent Labs  Lab 11/15/22 0849  GLUCAP 121*   D-Dimer No results for input(s): "DDIMER" in the last 72 hours. Hgb A1c No results for input(s): "HGBA1C" in the last 72 hours. Lipid Profile No results for input(s): "CHOL", "HDL", "LDLCALC", "TRIG", "CHOLHDL", "LDLDIRECT" in the last 72 hours. Thyroid function studies No results for input(s): "TSH", "T4TOTAL", "T3FREE", "THYROIDAB" in the last 72 hours.  Invalid input(s): "FREET3" Anemia work up No results for input(s): "VITAMINB12", "FOLATE", "FERRITIN", "TIBC", "IRON", "RETICCTPCT" in the last 72 hours. Urinalysis    Component Value Date/Time  COLORURINE YELLOW 11/12/2022 1131   APPEARANCEUR CLEAR 11/12/2022 1131   LABSPEC 1.016 11/12/2022 1131   PHURINE 5.5 11/12/2022 1131   GLUCOSEU NEGATIVE 11/12/2022 1131   HGBUR NEGATIVE 11/12/2022 1131   BILIRUBINUR NEGATIVE 11/12/2022 1131   KETONESUR 15 (A) 11/12/2022 1131   PROTEINUR 30 (A) 11/12/2022 1131   NITRITE NEGATIVE 11/12/2022 1131   LEUKOCYTESUR TRACE (A) 11/12/2022 1131    Sepsis Labs Recent Labs  Lab 11/12/22 1131 11/13/22 0321  WBC 7.4 7.8   Microbiology Recent Results (from the past 240 hour(s))  MRSA Next Gen by PCR, Nasal     Status: None   Collection Time: 11/12/22  3:05 PM   Specimen: Nasal Mucosa; Nasal Swab  Result Value Ref Range Status   MRSA by PCR Next Gen NOT DETECTED NOT DETECTED Final    Comment: (NOTE) The GeneXpert MRSA Assay (FDA approved for NASAL specimens only), is one component of a comprehensive MRSA colonization surveillance program. It is not intended to diagnose MRSA infection nor to guide or monitor treatment for MRSA infections. Test performance is not FDA approved in patients less than 62 years old. Performed at [facility], [address] [place], [place] [address]   Gastrointestinal Panel by PCR , Stool     Status: None   Collection Time: 11/13/22  2:14 PM   Specimen: Stool  Result Value Ref Range Status   Campylobacter species NOT DETECTED NOT DETECTED Final   Plesimonas shigelloides NOT DETECTED NOT DETECTED Final   Salmonella species NOT DETECTED NOT DETECTED Final   Yersinia enterocolitica NOT DETECTED NOT DETECTED Final   Vibrio species NOT DETECTED NOT DETECTED Final   Vibrio cholerae NOT DETECTED NOT DETECTED Final   Enteroaggregative E coli (EAEC) NOT DETECTED NOT DETECTED Final   Enteropathogenic E coli (EPEC) NOT DETECTED NOT DETECTED Final   Enterotoxigenic E coli (ETEC) NOT DETECTED NOT DETECTED Final   Shiga like toxin producing E coli (STEC) NOT DETECTED NOT DETECTED Final   Shigella/Enteroinvasive E coli (EIEC) NOT DETECTED NOT DETECTED Final   Cryptosporidium NOT DETECTED NOT DETECTED Final   Cyclospora cayetanensis NOT DETECTED NOT DETECTED Final   Entamoeba histolytica NOT DETECTED NOT DETECTED Final   Giardia lamblia NOT DETECTED NOT DETECTED Final   Adenovirus F40/41 NOT DETECTED NOT DETECTED Final   Astrovirus NOT DETECTED NOT DETECTED Final   Norovirus GI/GII NOT  DETECTED NOT DETECTED Final   Rotavirus A NOT DETECTED NOT DETECTED Final   Sapovirus (I, II, IV, and V) NOT DETECTED NOT DETECTED Final    Comment: Performed at [facility], [address] [place], [place] [address]  C Difficile Quick Screen w PCR reflex     Status: None   Collection Time: 11/13/22  2:14 PM   Specimen: STOOL  Result Value Ref Range Status   C Diff antigen NEGATIVE NEGATIVE Final   C Diff toxin NEGATIVE NEGATIVE Final   C Diff interpretation No C. difficile detected.  Final    Comment: Performed at [facility], [address], [place], [place] [address]  Respiratory (~20 pathogens) panel by PCR     Status: None   Collection Time: 11/13/22  2:14 PM   Specimen: Nasopharyngeal Swab; Respiratory  Result Value Ref Range Status   Adenovirus NOT DETECTED NOT DETECTED Final   Coronavirus 229E NOT DETECTED NOT DETECTED Final    Comment: (NOTE) The Coronavirus on the Respiratory Panel, DOES NOT test for the novel  Coronavirus (2019 nCoV)  Coronavirus HKU1 NOT DETECTED NOT DETECTED Final   Coronavirus NL63 NOT DETECTED NOT DETECTED Final   Coronavirus OC43 NOT DETECTED NOT DETECTED Final   Metapneumovirus NOT DETECTED NOT DETECTED Final   Rhinovirus / Enterovirus NOT DETECTED NOT DETECTED Final   Influenza A NOT DETECTED NOT DETECTED Final   Influenza B NOT DETECTED NOT DETECTED Final   Parainfluenza Virus 1 NOT DETECTED NOT DETECTED Final   Parainfluenza Virus 2 NOT DETECTED NOT DETECTED Final   Parainfluenza Virus 3 NOT DETECTED NOT DETECTED Final   Parainfluenza Virus 4 NOT DETECTED NOT DETECTED Final   Respiratory Syncytial Virus NOT DETECTED NOT DETECTED Final   Bordetella pertussis NOT DETECTED NOT DETECTED Final   Bordetella Parapertussis NOT DETECTED NOT DETECTED Final   Chlamydophila pneumoniae NOT DETECTED NOT DETECTED Final   Mycoplasma pneumoniae NOT DETECTED NOT DETECTED Final    Comment: Performed at [facility],  [address], [place], [place] [address]    SIGNED:   Marinda Elk, MD  [facility] 11/16/2022, 8:33 AM Pager   If 7PM-7AM, please contact night-coverage [email or web address] Password [email or web address]

## 2022-11-16 NOTE — Progress Notes (Signed)
Mobility Specialist - Progress Note   11/16/22 1443  Mobility  Activity Ambulated with assistance in hallway  Level of Assistance Standby assist, set-up cues, supervision of patient - no hands on  Assistive Device Front wheel walker  Distance Ambulated (ft) 240 ft  Activity Response Tolerated well  Mobility Referral Yes  $Mobility charge 1 Mobility  Mobility Specialist Start Time (ACUTE ONLY) 0223  Mobility Specialist Stop Time (ACUTE ONLY) 0240  Mobility Specialist Time Calculation (min) (ACUTE ONLY) 17 min   Pt received in recliner and agreeable to mobility. No complaints during session. Pt to recliner after session with all needs met.    [facility]

## 2022-11-16 NOTE — Plan of Care (Signed)
  Problem: Education: Goal: Knowledge of General Education information will improve Description: Including pain rating scale, medication(s)/side effects and non-pharmacologic comfort measures Outcome: Progressing   Problem: Health Behavior/Discharge Planning: Goal: Ability to manage health-related needs will improve Outcome: Progressing   Problem: Clinical Measurements: Goal: Ability to maintain clinical measurements within normal limits will improve Outcome: Progressing Goal: Will remain free from infection Outcome: Progressing Goal: Diagnostic test results will improve Outcome: Progressing Goal: Respiratory complications will improve Outcome: Progressing Goal: Cardiovascular complication will be avoided Outcome: Progressing   Problem: Activity: Goal: Risk for activity intolerance will decrease Outcome: Progressing   Problem: Nutrition: Goal: Adequate nutrition will be maintained Outcome: Progressing   Problem: Coping: Goal: Level of anxiety will decrease Outcome: Progressing   Problem: Elimination: Goal: Will not experience complications related to bowel motility Outcome: Progressing Goal: Will not experience complications related to urinary retention Outcome: Progressing   Problem: Pain Managment: Goal: General experience of comfort will improve Outcome: Progressing   Problem: Safety: Goal: Ability to remain free from injury will improve Outcome: Progressing   Problem: Skin Integrity: Goal: Risk for impaired skin integrity will decrease Outcome: Progressing  Pt A/Ox4 on RA. Up x1 w walker to bathroom freq throughout night. Prn tylenol given for abd pain. Additional dose of melatonin administered for sleep. Last Na at 130.

## 2022-11-16 NOTE — Care Management Important Message (Signed)
Important Message  Patient Details IM Letter given. Name: Yvonne Fry MRN: [id number] Date of Birth: 24-Jun-1928   Medicare Important Message Given:  Yes     Caren Macadam 11/16/2022, 4:03 PM

## 2022-11-16 NOTE — Progress Notes (Signed)
Pt escorted out to front entrance to transporter for transport back to facility via wheelchair

## 2022-11-16 NOTE — Progress Notes (Signed)
Sodium level 129. Message Dr. David Stall concerning confirmation for discharge.

## 2022-11-16 NOTE — Plan of Care (Signed)

## 2022-11-16 NOTE — TOC Transition Note (Signed)
Transition of Care [facility]) - CM/SW Discharge Note   Patient Details  Name: Yvonne Fry MRN: [id number] Date of Birth: Jun 16, 1928  [facility] of [facility]) CM/SW Contact:  Otelia Santee, LCSW Phone Number: 11/16/2022, 3:34 PM   Clinical Narrative:     Pt is to return to [facility] and will be going to SNF prior to [place]. RN to call report to [phone]. Per pt son is unable to provide transportation at discharge. [facility] to arrange pick up for pt to transfer her back to their facility.   Final next level of care: Skilled Nursing Facility Barriers to Discharge: Barriers Resolved   Patient Goals and CMS Choice CMS Medicare.gov Compare Post Acute Care list provided to:: Patient Choice offered to / list presented to : Patient  Discharge Placement                Patient chooses bed at: [facility] Patient to be transferred to facility by: [facility] Name of family member notified: Patient Patient and family notified of of transfer: 11/16/22  Discharge Plan and Services Additional resources added to the After Visit Summary for     Discharge Planning Services: CM Consult            DME Arranged: N/A DME Agency: NA                  Social Determinants of Health (SDOH) Interventions SDOH Screenings   Food Insecurity: No Food Insecurity (11/15/2022)  Housing: Low Risk  (11/15/2022)  Transportation Needs: No Transportation Needs (11/15/2022)  Utilities: Not At Risk (11/15/2022)  Tobacco Use: Low Risk  (11/12/2022)     Readmission Risk Interventions    11/16/2022    3:33 PM  Readmission Risk Prevention Plan  Post Dischage Appt Complete  Medication Screening Complete  Transportation Screening Complete

## 2022-11-17 ENCOUNTER — Non-Acute Institutional Stay (SKILLED_NURSING_FACILITY): Payer: Medicare Other | Admitting: Internal Medicine

## 2022-11-17 DIAGNOSIS — E871 Hypo-osmolality and hyponatremia: Secondary | ICD-10-CM | POA: Diagnosis not present

## 2022-11-17 DIAGNOSIS — M810 Age-related osteoporosis without current pathological fracture: Secondary | ICD-10-CM

## 2022-11-17 DIAGNOSIS — I10 Essential (primary) hypertension: Secondary | ICD-10-CM | POA: Diagnosis not present

## 2022-11-18 DIAGNOSIS — E871 Hypo-osmolality and hyponatremia: Secondary | ICD-10-CM | POA: Diagnosis not present

## 2022-11-18 DIAGNOSIS — R2689 Other abnormalities of gait and mobility: Secondary | ICD-10-CM | POA: Diagnosis not present

## 2022-11-18 DIAGNOSIS — M6259 Muscle wasting and atrophy, not elsewhere classified, multiple sites: Secondary | ICD-10-CM | POA: Diagnosis not present

## 2022-11-19 ENCOUNTER — Encounter: Payer: Self-pay | Admitting: Adult Health

## 2022-11-19 ENCOUNTER — Non-Acute Institutional Stay (SKILLED_NURSING_FACILITY): Payer: Medicare Other | Admitting: Adult Health

## 2022-11-19 DIAGNOSIS — N3281 Overactive bladder: Secondary | ICD-10-CM

## 2022-11-19 DIAGNOSIS — M810 Age-related osteoporosis without current pathological fracture: Secondary | ICD-10-CM

## 2022-11-19 DIAGNOSIS — I10 Essential (primary) hypertension: Secondary | ICD-10-CM

## 2022-11-19 DIAGNOSIS — R600 Localized edema: Secondary | ICD-10-CM | POA: Diagnosis not present

## 2022-11-19 DIAGNOSIS — M6259 Muscle wasting and atrophy, not elsewhere classified, multiple sites: Secondary | ICD-10-CM | POA: Diagnosis not present

## 2022-11-19 DIAGNOSIS — R2689 Other abnormalities of gait and mobility: Secondary | ICD-10-CM | POA: Diagnosis not present

## 2022-11-19 DIAGNOSIS — E871 Hypo-osmolality and hyponatremia: Secondary | ICD-10-CM | POA: Diagnosis not present

## 2022-11-19 NOTE — Progress Notes (Signed)
Location:  Medical illustrator of Service:  SNF (31) Provider:   Peggye Ley, ANP [facility] [phone] [name], MD  Patient Care Team: [name], MD as PCP - General (Internal Medicine)  Extended Emergency Contact Information Primary Emergency Contact: [facility] Phone: [phone] Mobile Phone: [phone] Relation: Son  Code Status:  Full Goals of care: Advanced Directive information    11/14/2022   10:00 PM  Advanced Directives  Does Patient Have a Medical Advance Directive? Yes  Type of Advance Directive Healthcare Power of Attorney  Does patient want to make changes to medical advance directive? No - Patient declined  Copy of Healthcare Power of Attorney in Chart? No - copy requested     Chief Complaint  Patient presents with   Acute Visit    F/u hyponatremia    HPI:  Pt is a 89 y.o. female seen today for a hospital f/u s/p admission from 11/12/22-11/16/22 due to generalized weakness found to have hyponatremia, hypocalcemia, and hypophosphatemia.  She had multiple episodes of diarrhea prior to admission.She had been having nausea and weakness.  NA on admit 116.  Seen by nephrology thought to be due to SIADH vs volume depletion. Received hypertonic saline and salt tabs. Continue to run low and tolvaptan was given. Also ordered fluid restriction and salt tabs. NA on d/c 130. BP was elevated during her stay, ARB was on hold. Hydralazine was ordered. She was discharged back to [facility] due to weakness and is not on a FR and is back on the ARB. BP is controlled.  KUB and CXR were normal during her hospitalization.   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  age indeterminate infarct of the right thalamus and hypoattenuation possibly due to sequela of chronic microvascular change  She reports a dry cough here no sputum  production or other symptoms.   She is working with therapy. She has a hx of peripheral neuropathy and is dragging her feet. Using  walker.  Past Medical History:  Diagnosis Date   Diverticulosis    and hemorrhoids   Hematuria, microscopic    Dr Gaspar Garbe negative   Hypertension    Osteopenia    Past Surgical History:  Procedure Laterality Date   APPENDECTOMY     COLONOSCOPY     hemorrhoids and diverticulosis   TOTAL ABDOMINAL HYSTERECTOMY     possible BSO    Allergies  Allergen Reactions   Neosporin [Neomycin-Bacitracin Zn-Polymyx]     Outpatient Encounter Medications as of 11/19/2022  Medication Sig   acetaminophen (TYLENOL) 500 MG tablet Take 1,000 mg by mouth every 8 (eight) hours as needed.   cetirizine (ZYRTEC) 10 MG tablet Take 10 mg by mouth daily.   cholecalciferol (VITAMIN D) 25 MCG (1000 UNIT) tablet Take 1,000 Units by mouth daily.   denosumab (PROLIA) 60 MG/ML SOSY injection Inject 60 mg into the skin every 6 (six) months.   melatonin 3 MG TABS tablet Take 3 mg by mouth at bedtime.   Multiple Vitamins-Minerals (MULTIVITAMIN PO) Take by mouth daily.   MYRBETRIQ 25 MG TB24 tablet Take 1 tablet by mouth daily.   olmesartan (BENICAR) 40 MG tablet Take 40 mg by mouth daily.   ondansetron (ZOFRAN) 4 MG tablet Take 4 mg by mouth.   Polyethyl Glycol-Propyl Glycol (SYSTANE ULTRA) 0.4-0.3 % SOLN Apply to eye.   Polyethyl Glycol-Propyl Glycol (SYSTANE) 0.4-0.3 %  GEL ophthalmic gel Place 1 application into both eyes.   triamcinolone (KENALOG) 0.1 % Apply 1 application topically 2 (two) times daily.   No facility-administered encounter medications on file as of 11/19/2022.    Review of Systems  Constitutional:  Positive for activity change. Negative for appetite change, chills, diaphoresis, fatigue, fever and unexpected weight change.  HENT:  Negative for congestion.   Respiratory:  Positive for cough. Negative for shortness of breath and wheezing.   Cardiovascular:   Positive for leg swelling. Negative for chest pain and palpitations.  Gastrointestinal:  Negative for abdominal distention, abdominal pain, constipation and diarrhea.  Genitourinary:  Positive for frequency (chronic). Negative for difficulty urinating and dysuria.  Musculoskeletal:  Positive for gait problem. Negative for arthralgias, back pain, joint swelling and myalgias.  Neurological:  Negative for dizziness, tremors, seizures, syncope, facial asymmetry, speech difficulty, weakness, light-headedness, numbness and headaches.  Psychiatric/Behavioral:  Negative for agitation, behavioral problems and confusion.     Immunization History  Administered Date(s) Administered   DTaP 07/12/2012   Fluad Quad(high Dose 65+) 01/20/2022   Influenza, High Dose Seasonal PF 05/21/2015   Influenza-Unspecified 03/09/2012, 01/23/2016, 02/08/2017, 01/11/2018, 02/09/2019   Moderna SARS-COV2 Booster Vaccination 12/13/2020   Moderna Sars-Covid-2 Vaccination 05/02/2019, 05/30/2019   Pneumococcal Conjugate-13 02/26/2014   Pneumococcal Polysaccharide-23 03/12/2010   Pertinent  Health Maintenance Due  Topic Date Due   DEXA SCAN  Never done   INFLUENZA VACCINE  11/19/2022       No data to display         Functional Status Survey:    Vitals:   11/19/22 1523  BP: 127/82  Pulse: 73  Resp: 17  Temp: (!) 96.8 F (36 C)  SpO2: 96%   There is no height or weight on file to calculate BMI. Physical Exam Vitals and nursing note reviewed.  Constitutional:      General: She is not in acute distress.    Appearance: She is not diaphoretic.  HENT:     Head: Normocephalic and atraumatic.  Neck:     Vascular: No JVD.  Cardiovascular:     Rate and Rhythm: Normal rate and regular rhythm.     Heart sounds: No murmur heard. Pulmonary:     Effort: Pulmonary effort is normal. No respiratory distress.     Breath sounds: Wheezing and rhonchi present.  Abdominal:     General: Bowel sounds are normal. There  is no distension.     Palpations: Abdomen is soft.     Tenderness: There is no abdominal tenderness.  Musculoskeletal:     Comments: Edema BLE R +1>L trace  Skin:    General: Skin is warm and dry.  Neurological:     Mental Status: She is alert and oriented to person, place, and time.  Psychiatric:        Mood and Affect: Mood normal.     Labs reviewed: Recent Labs    11/12/22 1131 11/12/22 1526 11/12/22 1855 11/13/22 0322 11/13/22 0815 11/13/22 1049 11/15/22 0346 11/15/22 1632 11/15/22 2308 11/16/22 1415  NA 116* 115*   < > 122* 123*   < > 126* 131* 130* 129*  K 4.0 3.9  --  3.4*  --   --  3.6  --   --   --   CL 84* 85*  --  91*  --   --  98  --   --   --   CO2 24 18*  --  20*  --   --  20*  --   --   --   GLUCOSE 100* 95  --  87  --   --  96  --   --   --   BUN 9 7*  --  7*  --   --  12  --   --   --   CREATININE 0.61 0.51  --  0.51  --   --  0.58  --   --   --   CALCIUM 8.4* 7.7*  --  7.7*  --   --  8.0*  --   --   --   MG 1.8  --   --   --   --   --   --   --   --   --   PHOS 2.4*  --   --   --  3.1  --   --   --   --   --    < > = values in this interval not displayed.   Recent Labs    11/12/22 1526  AST 26  ALT 16  ALKPHOS 45  BILITOT 0.8  PROT 6.7  ALBUMIN 3.6   Recent Labs    11/12/22 1131 11/13/22 0321  WBC 7.4 7.8  NEUTROABS 5.2  --   HGB 13.5 13.3  HCT 39.1 39.0  MCV 87.7 88.8  PLT 373 396   Lab Results  Component Value Date   TSH 3.131 11/13/2022   No results found for: "HGBA1C" Lab Results  Component Value Date   CHOL 208 (A) 02/15/2020   HDL 95 (A) 02/15/2020   LDLCALC 105 02/15/2020   TRIG 45 02/15/2020    Significant Diagnostic Results in last 30 days:  CT HEAD WO CONTRAST ( )  Result Date: 11/13/2022 CLINICAL DATA:  Headache, new onset (Age >= 51y) EXAM: CT HEAD WITHOUT CONTRAST TECHNIQUE: Contiguous axial images were obtained from the base of the skull through the vertex without intravenous contrast. RADIATION DOSE  REDUCTION: This exam was performed according to the departmental dose-optimization program which includes automated exposure control, adjustment of the mA and/or kV according to patient size and/or use of iterative reconstruction technique. COMPARISON:  None Available. FINDINGS: Brain: There is extensive periventricular hypoattenuation, which is nonspecific, and could represent sequela of severe chronic microvascular ischemic change. The left lentiform nucleus is difficult to visualize (series 3, image 16). There is an age indeterminate infarct in the right thalamus (series 5, image 25). No hydrocephalus. No extra-axial fluid collection. Vascular: No disproportionately hyperdense vessel or unexpected calcification. Skull: Normal. Negative for fracture or focal lesion. Sinuses/Orbits: No middle ear or mastoid effusion. Paranasal sinuses are clear. Orbits are notable for bilateral lens replacement, otherwise unremarkable. Other: None. IMPRESSION: 1. The left lentiform nucleus is difficult to visualize. 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 Recommend further evaluation with a brain MRI. 2. 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 Attention on MRI. 3. Extensive periventricular hypoattenuation, which is nonspecific, and could represent sequela of severe chronic microvascular ischemic change. These results will be called to the ordering clinician or representative by the Radiologist Assistant, and communication documented in the PACS or [employer]. Electronically Signed   By: Lorenza Cambridge M.D.   On: 11/13/2022 15:21   DG Chest 2 View  Result Date: 11/12/2022 CLINICAL DATA:  SIADH EXAM: CHEST - 2 VIEW COMPARISON:  None Available. FINDINGS: Cardiac shadow is within normal limits. Aortic  calcifications are noted. The lungs are clear bilaterally. No focal infiltrate or effusion is seen. Degenerative  changes of the thoracic spine are seen with a lower thoracic compression deformity of uncertain chronicity. IMPRESSION: No acute abnormality noted. Electronically Signed   By: Alcide Clever M.D.   On: 11/12/2022 20:26    Assessment/Plan 1. 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 Essential hypertension Controlled Continue Benicar  3. Senile osteoporosis ON Prolia per PCP  4. Overactive bladder Continue myrbetriq  5. Localized edema Noted to ankles, chronic R>L  6. Hypocalcemia Improved Repeat 8.8 11/19/2022  7. Hypophosphatemia Improved during hospitalization to 3.1  8. Deconditioning Continue to work with PT   Recommend IS 10 breath tid due to abnormal lung sounds. If sob, cough, fever, would order repeat CXR.  Family/ staff Communication: nurse  Labs/tests ordered:  BMP 11/23/22

## 2022-11-20 DIAGNOSIS — R2689 Other abnormalities of gait and mobility: Secondary | ICD-10-CM | POA: Diagnosis not present

## 2022-11-20 DIAGNOSIS — E871 Hypo-osmolality and hyponatremia: Secondary | ICD-10-CM | POA: Diagnosis not present

## 2022-11-20 DIAGNOSIS — M6259 Muscle wasting and atrophy, not elsewhere classified, multiple sites: Secondary | ICD-10-CM | POA: Diagnosis not present

## 2022-11-21 DIAGNOSIS — M6259 Muscle wasting and atrophy, not elsewhere classified, multiple sites: Secondary | ICD-10-CM | POA: Diagnosis not present

## 2022-11-21 DIAGNOSIS — E871 Hypo-osmolality and hyponatremia: Secondary | ICD-10-CM | POA: Diagnosis not present

## 2022-11-21 DIAGNOSIS — R2689 Other abnormalities of gait and mobility: Secondary | ICD-10-CM | POA: Diagnosis not present

## 2022-11-22 ENCOUNTER — Encounter: Payer: Self-pay | Admitting: Internal Medicine

## 2022-11-22 DIAGNOSIS — M6259 Muscle wasting and atrophy, not elsewhere classified, multiple sites: Secondary | ICD-10-CM | POA: Diagnosis not present

## 2022-11-22 DIAGNOSIS — R2689 Other abnormalities of gait and mobility: Secondary | ICD-10-CM | POA: Diagnosis not present

## 2022-11-22 DIAGNOSIS — E871 Hypo-osmolality and hyponatremia: Secondary | ICD-10-CM | POA: Diagnosis not present

## 2022-11-22 NOTE — Progress Notes (Signed)
Provider:   Location:  Medical illustrator of Service:  SNF (31)  PCP: [name], MD Patient Care Team: [name], MD as PCP - General (Internal Medicine)  Extended Emergency Contact Information Primary Emergency Contact: [facility] Phone: [phone] Mobile Phone: [phone] Relation: Son  Code Status: Full Code Goals of Care: Advanced Directive information    11/14/2022   10:00 PM  Advanced Directives  Does Patient Have a Medical Advance Directive? Yes  Type of Advance Directive Healthcare Power of Attorney  Does patient want to make changes to medical advance directive? No - Patient declined  Copy of Healthcare Power of Attorney in Chart? No - copy requested      Chief Complaint  Patient presents with   New Admit To SNF    HPI: Patient is a 89 y.o. 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 She has never used smokeless tobacco. She reports current alcohol use. She reports that she does not use drugs. Social History   Socioeconomic History   Marital status: Widowed    Spouse name: Not on file   Number  of children: Not on file   Years of education: Not on file   Highest education level: Not on file  Occupational History   Not on file  Tobacco Use   Smoking status: Never   Smokeless tobacco: Never  Substance and Sexual Activity   Alcohol use: Yes    Comment: glass of wine before dinner   Drug use: No   Sexual activity: Yes    Birth control/protection: Surgical    Comment: TAH  Other Topics Concern   Not on file  Social History Narrative   Not on file   Social Determinants of Health   Financial Resource Strain: Not on file  Food Insecurity: No Food Insecurity (11/15/2022)   Hunger Vital Sign    Worried About Running Out of Food in the Last Year: Never true    Ran Out of Food in the Last Year: Never true  Transportation Needs: No Transportation Needs (11/15/2022)   PRAPARE - Administrator, Civil Service (Medical): No    Lack of Transportation (Non-Medical): No  Physical Activity: Not on file  Stress: Not on file  Social Connections: Not on file  Intimate Partner Violence: Not At Risk (11/15/2022)   Humiliation, Afraid, Rape, and Kick questionnaire    Fear of Current or Ex-Partner: No    Emotionally Abused: No    Physically Abused: No    Sexually Abused: No    Functional Status Survey:  No family history on file.  Health Maintenance  Topic Date Due   Zoster Vaccines- Shingrix (1 of 2) Never done   DEXA SCAN  Never done   COVID-19 Vaccine (5 - 2023-24 season) 12/19/2021   DTaP/Tdap/Td (2 - Tdap) 07/13/2022   INFLUENZA VACCINE  11/19/2022   Medicare Annual Wellness (AWV)  03/04/2023   Pneumonia Vaccine 57+ Years old  Completed   HPV VACCINES  Aged Out    Allergies  Allergen Reactions   Neosporin [Neomycin-Bacitracin Zn-Polymyx]     Outpatient Encounter Medications as of 11/17/2022  Medication Sig   acetaminophen (TYLENOL) 500 MG tablet Take 1,000 mg by mouth every 8 (eight) hours as needed.   cetirizine (ZYRTEC) 10 MG tablet Take 10 mg by mouth  daily.   cholecalciferol (VITAMIN D) 25 MCG (1000 UNIT) tablet Take 1,000 Units by mouth daily.   denosumab (PROLIA) 60 MG/ML SOSY injection Inject 60 mg into the skin every 6 (six) months.   melatonin 3 MG TABS tablet Take 3 mg by mouth at bedtime.   Multiple Vitamins-Minerals (MULTIVITAMIN PO) Take by mouth daily.   MYRBETRIQ 25 MG TB24 tablet Take 1 tablet by mouth daily.   olmesartan (BENICAR) 40 MG tablet Take 40 mg by mouth daily.   ondansetron (ZOFRAN) 4 MG tablet Take 4 mg by mouth.   Polyethyl Glycol-Propyl Glycol (SYSTANE) 0.4-0.3 % GEL ophthalmic gel Place 1 application into both eyes.   [DISCONTINUED] Polyethyl Glycol-Propyl Glycol (SYSTANE ULTRA) 0.4-0.3 % SOLN Apply to eye.   [DISCONTINUED] triamcinolone (KENALOG) 0.1 % Apply 1 application topically 2 (two) times daily.   No facility-administered encounter medications on file as of 11/17/2022.    Review of Systems  Constitutional:  Negative for activity change and appetite change.  HENT: Negative.    Respiratory:  Negative for cough and shortness of breath.   Cardiovascular:  Negative for leg swelling.  Gastrointestinal:  Negative for constipation.  Genitourinary: Negative.   Musculoskeletal:  Negative for arthralgias, gait problem and myalgias.  Skin: Negative.   Neurological:  Positive for weakness. Negative for dizziness.  Psychiatric/Behavioral:  Negative for confusion, dysphoric mood and sleep disturbance.     Vitals:   11/22/22 2238  BP: 138/60  Pulse: 68  Resp: 16  Temp: 97.9 F (36.6 C)   There is no height or weight on file to calculate BMI. Physical Exam Vitals reviewed.  Constitutional:      Appearance: Normal appearance.  HENT:     Head: Normocephalic.     Nose: Nose normal.     Mouth/Throat:     Mouth: Mucous membranes are moist.     Pharynx: Oropharynx is clear.  Eyes:     Pupils: Pupils are equal, round, and reactive to light.  Cardiovascular:     Rate and Rhythm: Normal rate and regular  rhythm.     Pulses: Normal pulses.     Heart sounds: Normal heart sounds. No murmur heard. Pulmonary:     Effort: Pulmonary effort is normal.     Breath sounds: Normal breath sounds.  Abdominal:     General: Abdomen is flat. Bowel sounds are normal.     Palpations: Abdomen is soft.  Musculoskeletal:        General: No swelling.     Cervical back: Neck supple.  Skin:    General: Skin is warm.  Neurological:     General: No focal deficit present.     Mental Status: She is alert and oriented to person, place,  and time.  Psychiatric:        Mood and Affect: Mood normal.        Thought Content: Thought content normal.     Labs reviewed: Basic Metabolic Panel: Recent Labs    11/12/22 1131 11/12/22 1526 11/12/22 1855 11/13/22 0322 11/13/22 0815 11/13/22 1049 11/15/22 0346 11/15/22 1632 11/15/22 2308 11/16/22 1415  NA 116* 115*   < > 122* 123*   < > 126* 131* 130* 129*  K 4.0 3.9  --  3.4*  --   --  3.6  --   --   --   CL 84* 85*  --  91*  --   --  98  --   --   --   CO2 24 18*  --  20*  --   --  20*  --   --   --   GLUCOSE 100* 95  --  87  --   --  96  --   --   --   BUN 9 7*  --  7*  --   --  12  --   --   --   CREATININE 0.61 0.51  --  0.51  --   --  0.58  --   --   --   CALCIUM 8.4* 7.7*  --  7.7*  --   --  8.0*  --   --   --   MG 1.8  --   --   --   --   --   --   --   --   --   PHOS 2.4*  --   --   --  3.1  --   --   --   --   --    < > = values in this interval not displayed.   Liver Function Tests: Recent Labs    11/12/22 1526  AST 26  ALT 16  ALKPHOS 45  BILITOT 0.8  PROT 6.7  ALBUMIN 3.6   No results for input(s): "LIPASE", "AMYLASE" in the last 8760 hours. No results for input(s): "AMMONIA" in the last 8760 hours. CBC: Recent Labs    11/12/22 1131 11/13/22 0321  WBC 7.4 7.8  NEUTROABS 5.2  --   HGB 13.5 13.3  HCT 39.1 39.0  MCV 87.7 88.8  PLT 373 396   Cardiac Enzymes: No results for input(s): "CKTOTAL", "CKMB", "CKMBINDEX", "TROPONINI" in  the last 8760 hours. BNP: Invalid input(s): "POCBNP" No results found for: "HGBA1C" Lab Results  Component Value Date   TSH 3.131 11/13/2022   No results found for: "VITAMINB12" No results found for: "FOLATE" No results found for: "IRON", "TIBC", "FERRITIN"  Imaging and Procedures obtained prior to SNF admission: CT HEAD WO CONTRAST ( )  Result Date: 11/13/2022 CLINICAL DATA:  Headache, new onset (Age >= 51y) EXAM: CT HEAD WITHOUT CONTRAST TECHNIQUE: Contiguous axial images were obtained from the base of the skull through the vertex without intravenous contrast. RADIATION DOSE REDUCTION: This exam was performed according to the departmental dose-optimization program which includes automated exposure control, adjustment of the mA and/or kV according to patient size and/or use of iterative reconstruction technique. COMPARISON:  None Available. FINDINGS: Brain: There is extensive periventricular hypoattenuation, which is nonspecific, and could represent sequela of severe chronic microvascular ischemic change. The left lentiform nucleus is difficult to visualize (series 3, image 16). There is an age indeterminate infarct in the right thalamus (series 5, image 25). No hydrocephalus. No  extra-axial fluid collection. Vascular: No disproportionately hyperdense vessel or unexpected calcification. Skull: Normal. Negative for fracture or focal lesion. Sinuses/Orbits: No middle ear or mastoid effusion. Paranasal sinuses are clear. Orbits are notable for bilateral lens replacement, otherwise unremarkable. Other: None. IMPRESSION: 1. The left lentiform nucleus is difficult to visualize. 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 Recommend further evaluation with a brain MRI. 2. 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 Attention on MRI. 3. Extensive periventricular hypoattenuation, which is nonspecific, and  could represent sequela of severe chronic microvascular ischemic change. These results will be called to the ordering clinician or representative by the Radiologist Assistant, and communication documented in the PACS or [employer]. Electronically Signed   By: Lorenza Cambridge M.D.   On: 11/13/2022 15:21   DG Chest 2 View  Result Date: 11/12/2022 CLINICAL DATA:  SIADH EXAM: CHEST - 2 VIEW COMPARISON:  None Available. FINDINGS: Cardiac shadow is within normal limits. Aortic calcifications are noted. The lungs are clear bilaterally. No focal infiltrate or effusion is seen. Degenerative changes of the thoracic spine are seen with a lower thoracic compression deformity of uncertain chronicity. IMPRESSION: No acute abnormality noted. Electronically Signed   By: Alcide Clever M.D.   On: 11/12/2022 20:26    Assessment/Plan 1. Hyponatremia Repeat Labs pending ? Etiology SIADH   2. Essential hypertension BP stable on Benicar  3. Senile osteoporosis Prolia per PCP  4 Urinary Incontinence Myrbetriq 5 Weakness Working with therapy  Family/ staff Communication:   Labs/tests ordered:

## 2022-11-23 DIAGNOSIS — E871 Hypo-osmolality and hyponatremia: Secondary | ICD-10-CM | POA: Diagnosis not present

## 2022-11-23 DIAGNOSIS — R2689 Other abnormalities of gait and mobility: Secondary | ICD-10-CM | POA: Diagnosis not present

## 2022-11-23 DIAGNOSIS — M6259 Muscle wasting and atrophy, not elsewhere classified, multiple sites: Secondary | ICD-10-CM | POA: Diagnosis not present

## 2022-11-24 DIAGNOSIS — R278 Other lack of coordination: Secondary | ICD-10-CM | POA: Diagnosis not present

## 2022-11-24 DIAGNOSIS — M6259 Muscle wasting and atrophy, not elsewhere classified, multiple sites: Secondary | ICD-10-CM | POA: Diagnosis not present

## 2022-11-24 DIAGNOSIS — N1831 Chronic kidney disease, stage 3a: Secondary | ICD-10-CM | POA: Diagnosis not present

## 2022-11-24 DIAGNOSIS — R2681 Unsteadiness on feet: Secondary | ICD-10-CM | POA: Diagnosis not present

## 2022-11-25 DIAGNOSIS — Z09 Encounter for follow-up examination after completed treatment for conditions other than malignant neoplasm: Secondary | ICD-10-CM | POA: Diagnosis not present

## 2022-11-25 DIAGNOSIS — R2689 Other abnormalities of gait and mobility: Secondary | ICD-10-CM | POA: Diagnosis not present

## 2022-11-25 DIAGNOSIS — M6259 Muscle wasting and atrophy, not elsewhere classified, multiple sites: Secondary | ICD-10-CM | POA: Diagnosis not present

## 2022-11-25 DIAGNOSIS — E871 Hypo-osmolality and hyponatremia: Secondary | ICD-10-CM | POA: Diagnosis not present

## 2022-11-26 DIAGNOSIS — E871 Hypo-osmolality and hyponatremia: Secondary | ICD-10-CM | POA: Diagnosis not present

## 2022-12-03 DIAGNOSIS — E871 Hypo-osmolality and hyponatremia: Secondary | ICD-10-CM | POA: Diagnosis not present

## 2022-12-10 DIAGNOSIS — E871 Hypo-osmolality and hyponatremia: Secondary | ICD-10-CM | POA: Diagnosis not present

## 2022-12-10 DIAGNOSIS — M6259 Muscle wasting and atrophy, not elsewhere classified, multiple sites: Secondary | ICD-10-CM | POA: Diagnosis not present

## 2022-12-10 DIAGNOSIS — R2689 Other abnormalities of gait and mobility: Secondary | ICD-10-CM | POA: Diagnosis not present

## 2022-12-23 DIAGNOSIS — H5203 Hypermetropia, bilateral: Secondary | ICD-10-CM | POA: Diagnosis not present

## 2022-12-23 DIAGNOSIS — H04123 Dry eye syndrome of bilateral lacrimal glands: Secondary | ICD-10-CM | POA: Diagnosis not present

## 2022-12-23 DIAGNOSIS — Z961 Presence of intraocular lens: Secondary | ICD-10-CM | POA: Diagnosis not present

## 2022-12-23 DIAGNOSIS — H353132 Nonexudative age-related macular degeneration, bilateral, intermediate dry stage: Secondary | ICD-10-CM | POA: Diagnosis not present

## 2022-12-30 DIAGNOSIS — E871 Hypo-osmolality and hyponatremia: Secondary | ICD-10-CM | POA: Diagnosis not present

## 2023-02-04 DIAGNOSIS — I10 Essential (primary) hypertension: Secondary | ICD-10-CM | POA: Diagnosis not present

## 2023-02-04 DIAGNOSIS — M810 Age-related osteoporosis without current pathological fracture: Secondary | ICD-10-CM | POA: Diagnosis not present

## 2023-02-18 DIAGNOSIS — E222 Syndrome of inappropriate secretion of antidiuretic hormone: Secondary | ICD-10-CM | POA: Diagnosis not present

## 2023-02-18 DIAGNOSIS — I10 Essential (primary) hypertension: Secondary | ICD-10-CM | POA: Diagnosis not present

## 2023-02-22 DIAGNOSIS — M810 Age-related osteoporosis without current pathological fracture: Secondary | ICD-10-CM | POA: Diagnosis not present

## 2023-03-03 DIAGNOSIS — I10 Essential (primary) hypertension: Secondary | ICD-10-CM | POA: Diagnosis not present

## 2023-03-03 DIAGNOSIS — M810 Age-related osteoporosis without current pathological fracture: Secondary | ICD-10-CM | POA: Diagnosis not present

## 2023-03-08 DIAGNOSIS — R0989 Other specified symptoms and signs involving the circulatory and respiratory systems: Secondary | ICD-10-CM | POA: Diagnosis not present

## 2023-03-08 DIAGNOSIS — Z0000 Encounter for general adult medical examination without abnormal findings: Secondary | ICD-10-CM | POA: Diagnosis not present

## 2023-03-08 DIAGNOSIS — R2689 Other abnormalities of gait and mobility: Secondary | ICD-10-CM | POA: Diagnosis not present

## 2023-03-08 DIAGNOSIS — E222 Syndrome of inappropriate secretion of antidiuretic hormone: Secondary | ICD-10-CM | POA: Diagnosis not present

## 2023-03-08 DIAGNOSIS — Z23 Encounter for immunization: Secondary | ICD-10-CM | POA: Diagnosis not present

## 2023-04-16 ENCOUNTER — Other Ambulatory Visit: Payer: Self-pay

## 2023-04-16 DIAGNOSIS — R0989 Other specified symptoms and signs involving the circulatory and respiratory systems: Secondary | ICD-10-CM

## 2023-04-22 NOTE — Progress Notes (Signed)
 Patient ID: Yvonne Fry, female   DOB: [birth year], 89 y.o.   MRN: [id number]  Reason for Consult: New Patient (Initial Visit)   Referred by Clarice Nottingham, MD  Subjective:     HPI  Yvonne Fry is a 89 y.o. female who presents for evaluation of PAD.  She reports some discoloration and coolness to her right foot.  She denies calf claudication although does not walk as much as she used to.  She denies rest pain, she denies ulceration.  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  She is a never smoker.  Past Medical History:  Diagnosis Date   Diverticulosis    and hemorrhoids   Hematuria, microscopic    Dr Lonnell negative   Hypertension    Osteopenia    History reviewed. No pertinent family history. Past Surgical History:  Procedure Laterality Date   APPENDECTOMY     COLONOSCOPY     hemorrhoids and diverticulosis   TOTAL ABDOMINAL HYSTERECTOMY     possible BSO    Short Social History:  Social History   Tobacco Use   Smoking status: Never   Smokeless tobacco: Never  Substance Use Topics   Alcohol  use: Yes    Comment: glass of wine before dinner    Allergies  Allergen Reactions   Bacitracin-Polymyxin B     Other Reaction(s): Unknown   Calcium     Other Reaction(s): Unknown   Hydrochlorothiazide     Other Reaction(s): hyponatremia, Other (See Comments)   Neosporin [Neomycin-Bacitracin Zn-Polymyx]    Sertraline Other (See Comments)    Other Reaction(s): felt funny   Trazodone     Other Reaction(s): disoriented, Other (See Comments)   Amlodipine Rash    Other Reaction(s): severe rash    Current Outpatient Medications  Medication Sig Dispense Refill   acetaminophen  (TYLENOL ) 500 MG tablet Take 1,000 mg by mouth every 8 (eight) hours as needed.     cetirizine (ZYRTEC) 10 MG tablet Take 10 mg by mouth daily.     cholecalciferol (VITAMIN D ) 25 MCG (1000 UNIT) tablet Take 1,000 Units by mouth daily.     denosumab   (PROLIA ) 60 MG/ML SOSY injection Inject 60 mg into the skin every 6 (six) months.     melatonin 3 MG TABS tablet Take 3 mg by mouth at bedtime.     Multiple Vitamins-Minerals (MULTIVITAMIN PO) Take by mouth daily.     MYRBETRIQ 25 MG TB24 tablet Take 1 tablet by mouth daily.     olmesartan (BENICAR) 40 MG tablet Take 40 mg by mouth daily.     ondansetron  (ZOFRAN ) 4 MG tablet Take 4 mg by mouth.     Polyethyl Glycol-Propyl Glycol (SYSTANE) 0.4-0.3 % GEL ophthalmic gel Place 1 application into both eyes.     No current facility-administered medications for this visit.    REVIEW OF SYSTEMS   All other systems were reviewed and are negative     Objective:  Objective   Vitals:   04/23/23 1041  BP: (!) 153/90  Pulse: 71  Resp: 20  Temp: 98 F (36.7 C)  SpO2: 98%  Weight: 112 lb (50.8 kg)  Height: 5' 4 (1.626 m)   Body mass index is 19.22 kg/m.  Physical Exam General: no acute distress Cardiac: hemodynamically stable, nontachycardic Pulm: normal work of breathing GI: non-tender, no pulsatile mass Neuro: alert, no focal deficit Extremities: Venous congestion of bilateral feet with some venous stasis skin changes  to bilateral shins, no wounds or history of previous wounds. Vascular:   Right: Palpable DP, PT  Left: Palpable DP, PT   Data: ABI +---------+------------------+-----+---------+--------+  Right   Rt Pressure (mmHg)IndexWaveform Comment   +---------+------------------+-----+---------+--------+  Brachial 165                                       +---------+------------------+-----+---------+--------+  PTA     187               1.06 triphasic          +---------+------------------+-----+---------+--------+  DP      197               1.11 triphasic          +---------+------------------+-----+---------+--------+  Great Toe122               0.69 Abnormal           +---------+------------------+-----+---------+--------+    +---------+------------------+-----+---------+-------+  Left    Lt Pressure (mmHg)IndexWaveform Comment  +---------+------------------+-----+---------+-------+  Brachial 177                                      +---------+------------------+-----+---------+-------+  PTA     189               1.07 triphasic         +---------+------------------+-----+---------+-------+  DP      195               1.10 triphasic         +---------+------------------+-----+---------+-------+  Great Toe136               0.77 Normal            +---------+------------------+-----+---------+-------+       Assessment/Plan:     Yvonne Fry is a 89 y.o. female presenting for evaluation for right foot discoloration and coolness.  I explained that she does not have arterial disease based on physical exam and the vascular lab studies today.  I explained that she likely has some element of chronic venous insufficiency given the discoloration on her shins and the venous congestion in her bilateral feet.  I explained this is not medically dangerous and she is also asymptomatic with minimal swelling and denies aching/pain.  Follow-up as needed    Norman GORMAN Serve MD [facility]

## 2023-04-23 ENCOUNTER — Ambulatory Visit (HOSPITAL_COMMUNITY)
Admission: RE | Admit: 2023-04-23 | Discharge: 2023-04-23 | Disposition: A | Discharge status: Home / Self Care | Payer: Medicare Other | Source: Ambulatory Visit | Attending: Vascular Surgery | Admitting: Vascular Surgery

## 2023-04-23 ENCOUNTER — Encounter: Payer: Self-pay | Admitting: Vascular Surgery

## 2023-04-23 ENCOUNTER — Ambulatory Visit: Payer: Medicare Other | Admitting: Vascular Surgery

## 2023-04-23 VITALS — BP 153/90 | HR 71 | Temp 98.0°F | Resp 20 | Ht 64.0 in | Wt 112.0 lb

## 2023-04-23 DIAGNOSIS — R0989 Other specified symptoms and signs involving the circulatory and respiratory systems: Secondary | ICD-10-CM | POA: Insufficient documentation

## 2023-04-23 DIAGNOSIS — I872 Venous insufficiency (chronic) (peripheral): Secondary | ICD-10-CM

## 2023-04-23 LAB — VAS US ABI WITH/WO TBI
Left ABI: 1.1
Right ABI: 1.11

## 2023-08-06 ENCOUNTER — Encounter (HOSPITAL_BASED_OUTPATIENT_CLINIC_OR_DEPARTMENT_OTHER): Payer: Self-pay | Admitting: Emergency Medicine

## 2023-08-06 ENCOUNTER — Other Ambulatory Visit: Payer: Self-pay

## 2023-08-06 ENCOUNTER — Emergency Department (HOSPITAL_BASED_OUTPATIENT_CLINIC_OR_DEPARTMENT_OTHER): Admitting: Radiology

## 2023-08-06 ENCOUNTER — Emergency Department (HOSPITAL_BASED_OUTPATIENT_CLINIC_OR_DEPARTMENT_OTHER)
Admission: EM | Admit: 2023-08-06 | Discharge: 2023-08-06 | Disposition: A | Discharge status: Home / Self Care | Attending: Emergency Medicine | Admitting: Emergency Medicine

## 2023-08-06 DIAGNOSIS — J929 Pleural plaque without asbestos: Secondary | ICD-10-CM | POA: Diagnosis not present

## 2023-08-06 DIAGNOSIS — I700 Atherosclerosis of aorta: Secondary | ICD-10-CM | POA: Diagnosis not present

## 2023-08-06 DIAGNOSIS — R5383 Other fatigue: Secondary | ICD-10-CM | POA: Insufficient documentation

## 2023-08-06 DIAGNOSIS — R062 Wheezing: Secondary | ICD-10-CM | POA: Insufficient documentation

## 2023-08-06 DIAGNOSIS — R093 Abnormal sputum: Secondary | ICD-10-CM | POA: Insufficient documentation

## 2023-08-06 DIAGNOSIS — I771 Stricture of artery: Secondary | ICD-10-CM | POA: Diagnosis not present

## 2023-08-06 DIAGNOSIS — R059 Cough, unspecified: Secondary | ICD-10-CM | POA: Diagnosis not present

## 2023-08-06 DIAGNOSIS — R051 Acute cough: Secondary | ICD-10-CM | POA: Insufficient documentation

## 2023-08-06 LAB — RESP PANEL BY RT-PCR (RSV, FLU A&B, COVID)  RVPGX2
Influenza A by PCR: NEGATIVE
Influenza B by PCR: NEGATIVE
Resp Syncytial Virus by PCR: NEGATIVE
SARS Coronavirus 2 by RT PCR: NEGATIVE

## 2023-08-06 NOTE — ED Triage Notes (Signed)
 From [facility], c/o cough and "feeling bad" x 3 days. Denies CP or SHOB. States was seen by [facility] provider and told to follow up with PCP, but unable to see them today d/t holiday.

## 2023-08-06 NOTE — Discharge Instructions (Signed)
 You were seen for your cough in the emergency department. It is likely from an upper respiratory tract infection  At home, please take tea with honey for your cough.    Check your MyChart online for the results of any tests that had not resulted by the time you left the emergency department.   Follow-up with your primary doctor in 2-3 days regarding your visit.    Return immediately to the emergency department if you experience any of the following: difficulty breathing, or any other concerning symptoms.    Thank you for visiting our Emergency Department. It was a pleasure taking care of you today.

## 2023-08-06 NOTE — ED Notes (Signed)
 Patient given discharge instructions. Questions were answered. Patient verbalized understanding of discharge instructions and care at home.

## 2023-08-06 NOTE — ED Notes (Addendum)
 Phone call to [facility] to inquire status of CXR; has not been read. Advised they will read it now.

## 2023-08-06 NOTE — ED Provider Notes (Signed)
 [place] EMERGENCY DEPARTMENT AT [facility] Provider Note   CSN: [id number] Arrival date & time: 08/06/23  1041     History  Chief Complaint  Patient presents with   Cough    Yvonne Fry is a 89 y.o. female.  89 year old female who presents to the emergency department with cough and fatigue for 3 days.  Patient reports for the past 2 days she has had a cough productive of yellow sputum.  Also has been feeling fatigued.  No fevers or chills.  Denies any chest pain or shortness of breath.  No leg swelling.  Wanted to go see her PCP but was unable to today because of the holiday.  Did see the [facility] nurse who reported that she might of heard some wheezing and referred her to the emergency department.  Has been trying Mucinex.  No history of COPD or asthma or smoking.       Home Medications Prior to Admission medications   Medication Sig Start Date End Date Taking? Authorizing Provider  acetaminophen  (TYLENOL ) 500 MG tablet Take 1,000 mg by mouth every 8 (eight) hours as needed.    [provider]  cetirizine (ZYRTEC) 10 MG tablet Take 10 mg by mouth daily. 06/17/21   [provider]  cholecalciferol (VITAMIN D ) 25 MCG (1000 UNIT) tablet Take 1,000 Units by mouth daily.    [provider]  denosumab  (PROLIA ) 60 MG/ML SOSY injection Inject 60 mg into the skin every 6 (six) months.    [provider]  melatonin 3 MG TABS tablet Take 3 mg by mouth at bedtime.    [provider]  Multiple Vitamins-Minerals (MULTIVITAMIN PO) Take by mouth daily.    [provider]  MYRBETRIQ 25 MG TB24 tablet Take 1 tablet by mouth daily. 06/25/21   [provider]  olmesartan (BENICAR) 40 MG tablet Take 40 mg by mouth daily. 06/25/21   [provider]  ondansetron  (ZOFRAN ) 4 MG tablet Take 4 mg by mouth. 11/11/22   [provider]  Polyethyl Glycol-Propyl Glycol (SYSTANE) 0.4-0.3 % GEL ophthalmic gel Place 1  application into both eyes.    [provider]      Allergies    Bacitracin-polymyxin b, Calcium, Hydrochlorothiazide, Neosporin [neomycin-bacitracin zn-polymyx], Sertraline, Trazodone, and Amlodipine    Review of Systems   Review of Systems  Physical Exam Updated Vital Signs BP (!) 182/90   Pulse 70   Temp 98.4 F (36.9 C) (Oral)   Resp (!) 21   SpO2 97%  Physical Exam Vitals and nursing note reviewed.  Constitutional:      General: She is not in acute distress.    Appearance: She is well-developed.  HENT:     Head: Normocephalic and atraumatic.     Right Ear: External ear normal.     Left Ear: External ear normal.     Nose: Nose normal.  Eyes:     Extraocular Movements: Extraocular movements intact.     Conjunctiva/sclera: Conjunctivae normal.     Pupils: Pupils are equal, round, and reactive to light.  Cardiovascular:     Rate and Rhythm: Normal rate and regular rhythm.     Heart sounds: No murmur heard. Pulmonary:     Effort: Pulmonary effort is normal. No respiratory distress.     Breath sounds: Normal breath sounds.  Musculoskeletal:     Cervical back: Normal range of motion and neck supple.     Right lower leg: No edema.  Left lower leg: No edema.  Skin:    General: Skin is warm and dry.  Neurological:     Mental Status: She is alert and oriented to person, place, and time. Mental status is at baseline.  Psychiatric:        Mood and Affect: Mood normal.     ED Results / Procedures / Treatments   Labs (all labs ordered are listed, but only abnormal results are displayed) Labs Reviewed  RESP PANEL BY RT-PCR (RSV, FLU A&B, COVID)  [id number]    EKG EKG Interpretation Date/Time:  Friday August 06 2023 10:53:27 EDT Ventricular Rate:  78 PR Interval:  172 QRS Duration:  93 QT Interval:  395 QTC Calculation: 450 R Axis:   -28  Text Interpretation: Sinus rhythm Borderline left axis deviation Borderline low voltage, extremity leads Confirmed by  Shyrl Doyne [phone]) on 08/06/2023 11:45:35 AM  Radiology DG Chest 2 View Result Date: 08/06/2023 CLINICAL DATA:  cough EXAM: CHEST - 2 VIEW COMPARISON:  November 12, 2022 FINDINGS: Flattening of both hemidiaphragms. No focal airspace consolidation, pleural effusion, or pneumothorax. Calcified granuloma in the right mid lung. Biapical pleural thickening. no cardiomegaly. Tortuous aorta with aortic atherosclerosis. No acute fracture or destructive lesions. Multilevel degenerative disc disease of the spine. Chronic compression deformity in the lower thoracic spine. Diffuse osteopenia. IMPRESSION: No acute cardiopulmonary abnormality. Electronically Signed   By: Rance Burrows M.D.   On: 08/06/2023 14:27    Procedures Procedures    Medications Ordered in ED Medications - No data to display  ED Course/ Medical Decision Making/ A&P                                 Medical Decision Making Amount and/or Complexity of Data Reviewed Radiology: ordered.   Yvonne Fry is a 89 y.o. female who presents to the emergency department with cough and fatigue  Initial Ddx:  URI, pneumonia, COPD, CHF  MDM/Course:  Patient resents emergency department with cough and fatigue for several days.  On arrival was not febrile.  Not septic appearing.  No wheezing or abnormal lung sounds.  No lower extremity edema.  She satting well on room air.  Appears very comfortable and well.  Chest x-ray without pneumonia.  COVID and flu are negative.  Will have her follow-up with her primary doctor in several days.  I suspect that she has a URI.    This patient presents to the ED for concern of complaints listed in HPI, this involves an extensive number of treatment options, and is a complaint that carries with it a high risk of complications and morbidity. Disposition including potential need for admission considered.   Dispo: DC Home. Return precautions discussed including, but not limited to, those listed in the AVS.  Allowed pt time to ask questions which were answered fully prior to dc.  Records reviewed Outpatient Clinic Notes I independently reviewed the following imaging with scope of interpretation limited to determining acute life threatening conditions related to emergency care: Chest x-ray and agree with the radiologist interpretation with the following exceptions: none I personally reviewed and interpreted cardiac monitoring: normal sinus rhythm  I personally reviewed and interpreted the pt's EKG: see above for interpretation  I have reviewed the patients home medications and made adjustments as needed Social Determinants of health:  Geriatric  Portions of this note were generated with Scientist, clinical (histocompatibility and immunogenetics). Dictation errors may occur despite best  attempts at proofreading.     Final Clinical Impression(s) / ED Diagnoses Final diagnoses:  Acute cough    Rx / DC Orders ED Discharge Orders     None         Ninetta Basket, MD 08/06/23 [phone]

## 2023-08-09 DIAGNOSIS — Z09 Encounter for follow-up examination after completed treatment for conditions other than malignant neoplasm: Secondary | ICD-10-CM | POA: Diagnosis not present

## 2023-08-09 DIAGNOSIS — R051 Acute cough: Secondary | ICD-10-CM | POA: Diagnosis not present

## 2023-08-23 DIAGNOSIS — M810 Age-related osteoporosis without current pathological fracture: Secondary | ICD-10-CM | POA: Diagnosis not present

## 2023-09-08 DIAGNOSIS — E871 Hypo-osmolality and hyponatremia: Secondary | ICD-10-CM | POA: Diagnosis not present

## 2023-09-08 DIAGNOSIS — R5383 Other fatigue: Secondary | ICD-10-CM | POA: Diagnosis not present

## 2023-09-08 DIAGNOSIS — R053 Chronic cough: Secondary | ICD-10-CM | POA: Diagnosis not present

## 2023-09-08 DIAGNOSIS — I499 Cardiac arrhythmia, unspecified: Secondary | ICD-10-CM | POA: Diagnosis not present

## 2023-09-20 DIAGNOSIS — R238 Other skin changes: Secondary | ICD-10-CM | POA: Diagnosis not present

## 2023-09-20 DIAGNOSIS — E871 Hypo-osmolality and hyponatremia: Secondary | ICD-10-CM | POA: Diagnosis not present

## 2023-09-20 DIAGNOSIS — I10 Essential (primary) hypertension: Secondary | ICD-10-CM | POA: Diagnosis not present

## 2023-10-14 DIAGNOSIS — N3289 Other specified disorders of bladder: Secondary | ICD-10-CM | POA: Diagnosis not present

## 2023-10-14 DIAGNOSIS — R051 Acute cough: Secondary | ICD-10-CM | POA: Diagnosis not present

## 2023-11-16 DIAGNOSIS — M2011 Hallux valgus (acquired), right foot: Secondary | ICD-10-CM | POA: Diagnosis not present

## 2023-11-16 DIAGNOSIS — M21371 Foot drop, right foot: Secondary | ICD-10-CM | POA: Diagnosis not present

## 2023-11-16 DIAGNOSIS — L89891 Pressure ulcer of other site, stage 1: Secondary | ICD-10-CM | POA: Diagnosis not present

## 2024-01-03 DIAGNOSIS — H5203 Hypermetropia, bilateral: Secondary | ICD-10-CM | POA: Diagnosis not present

## 2024-01-03 DIAGNOSIS — Z961 Presence of intraocular lens: Secondary | ICD-10-CM | POA: Diagnosis not present

## 2024-01-03 DIAGNOSIS — H353132 Nonexudative age-related macular degeneration, bilateral, intermediate dry stage: Secondary | ICD-10-CM | POA: Diagnosis not present
